# Patient Record
Sex: Female | Born: 1960 | Race: White | Hispanic: No | Marital: Single | Smoking: Former smoker
Health system: Southern US, Community
[De-identification: ages and names within clinical notes are randomized; demographics above are authoritative.]

## PROBLEM LIST (undated history)

## (undated) DIAGNOSIS — M1990 Unspecified osteoarthritis, unspecified site: Secondary | ICD-10-CM

## (undated) DIAGNOSIS — K219 Gastro-esophageal reflux disease without esophagitis: Secondary | ICD-10-CM

## (undated) DIAGNOSIS — E7800 Pure hypercholesterolemia, unspecified: Secondary | ICD-10-CM

## (undated) DIAGNOSIS — I10 Essential (primary) hypertension: Secondary | ICD-10-CM

---

## 2008-10-18 ENCOUNTER — Ambulatory Visit: Payer: Self-pay | Admitting: Specialist

## 2008-11-07 ENCOUNTER — Ambulatory Visit: Payer: Self-pay | Admitting: Specialist

## 2009-07-19 ENCOUNTER — Ambulatory Visit: Payer: Self-pay | Admitting: Gastroenterology

## 2009-08-27 ENCOUNTER — Emergency Department: Payer: Self-pay | Admitting: Internal Medicine

## 2009-10-19 ENCOUNTER — Ambulatory Visit: Payer: Self-pay | Admitting: Family

## 2010-10-23 ENCOUNTER — Ambulatory Visit: Payer: Self-pay | Admitting: Internal Medicine

## 2011-10-29 ENCOUNTER — Ambulatory Visit: Payer: Self-pay | Admitting: Internal Medicine

## 2011-12-23 DIAGNOSIS — I10 Essential (primary) hypertension: Secondary | ICD-10-CM | POA: Insufficient documentation

## 2012-11-05 ENCOUNTER — Ambulatory Visit: Payer: Self-pay | Admitting: Internal Medicine

## 2013-03-17 DIAGNOSIS — I8393 Asymptomatic varicose veins of bilateral lower extremities: Secondary | ICD-10-CM | POA: Insufficient documentation

## 2013-06-05 ENCOUNTER — Emergency Department: Payer: Self-pay | Admitting: Emergency Medicine

## 2013-11-09 ENCOUNTER — Ambulatory Visit: Payer: Self-pay | Admitting: Nurse Practitioner

## 2014-08-25 DIAGNOSIS — M109 Gout, unspecified: Secondary | ICD-10-CM | POA: Insufficient documentation

## 2014-11-10 ENCOUNTER — Ambulatory Visit: Payer: Self-pay | Admitting: Nurse Practitioner

## 2015-07-10 DIAGNOSIS — N200 Calculus of kidney: Secondary | ICD-10-CM | POA: Insufficient documentation

## 2015-07-20 ENCOUNTER — Inpatient Hospital Stay: Payer: PRIVATE HEALTH INSURANCE | Attending: Internal Medicine | Admitting: Internal Medicine

## 2015-07-20 ENCOUNTER — Encounter: Payer: Self-pay | Admitting: Internal Medicine

## 2015-07-20 ENCOUNTER — Inpatient Hospital Stay: Payer: PRIVATE HEALTH INSURANCE

## 2015-07-20 VITALS — BP 154/65 | HR 85 | Temp 96.2°F | Resp 18 | Ht 64.0 in | Wt 177.4 lb

## 2015-07-20 DIAGNOSIS — I10 Essential (primary) hypertension: Secondary | ICD-10-CM | POA: Diagnosis not present

## 2015-07-20 DIAGNOSIS — Z79899 Other long term (current) drug therapy: Secondary | ICD-10-CM | POA: Diagnosis not present

## 2015-07-20 DIAGNOSIS — D696 Thrombocytopenia, unspecified: Secondary | ICD-10-CM | POA: Insufficient documentation

## 2015-07-20 LAB — CBC
HCT: 36.7 % (ref 35.0–47.0)
HEMOGLOBIN: 12.3 g/dL (ref 12.0–16.0)
MCH: 31 pg (ref 26.0–34.0)
MCHC: 33.6 g/dL (ref 32.0–36.0)
MCV: 92.3 fL (ref 80.0–100.0)
PLATELETS: 231 10*3/uL (ref 150–440)
RBC: 3.97 MIL/uL (ref 3.80–5.20)
RDW: 13.1 % (ref 11.5–14.5)
WBC: 7.5 10*3/uL (ref 3.6–11.0)

## 2015-07-20 LAB — IRON AND TIBC
Iron: 75 ug/dL (ref 28–170)
SATURATION RATIOS: 20 % (ref 10.4–31.8)
TIBC: 374 ug/dL (ref 250–450)
UIBC: 299 ug/dL

## 2015-07-20 LAB — RETICULOCYTES
RBC.: 3.97 MIL/uL (ref 3.80–5.20)
RETIC COUNT ABSOLUTE: 47.6 10*3/uL (ref 19.0–183.0)
RETIC CT PCT: 1.2 % (ref 0.4–3.1)

## 2015-07-20 LAB — PROTIME-INR
INR: 0.95
PROTHROMBIN TIME: 12.7 s (ref 11.4–15.0)

## 2015-07-20 LAB — FERRITIN: Ferritin: 37 ng/mL (ref 11–307)

## 2015-07-20 LAB — VITAMIN B12: Vitamin B-12: 279 pg/mL (ref 180–914)

## 2015-07-20 LAB — LACTATE DEHYDROGENASE: LDH: 259 U/L — ABNORMAL HIGH (ref 98–192)

## 2015-07-20 LAB — FIBRINOGEN: FIBRINOGEN: 405 mg/dL (ref 210–470)

## 2015-07-20 LAB — FOLATE: FOLATE: 32 ng/mL (ref >5.9)

## 2015-07-20 LAB — APTT: aPTT: 29 seconds (ref 24–36)

## 2015-07-20 NOTE — Progress Notes (Signed)
[place]  Telephone:(336) [phone] [phone]     ID: Krista English OB: Feb 11, 1961  MR#: [id number]  TMA#:[id number]  No care team member to display  CHIEF COMPLAINT/DIAGNOSIS:  Newly diagnosed Thrombocytopenia of unclear etiology -  patient referred here for Hematology evaluation.  (Labs on 06/25/15 shows platelet count 109, hemoglobin 12.4, MCV 93, W BC 5.7, 62% neutrophils, 27% lymphocytes, 8% monocytes, 2% eosinophils, ANC 3.6, creatinine 0.84, calcium 9.5, LFT unremarkable except GGT of 82, TSH normal at 2.92. On 03/16/15, platelet count normal at 289).  HISTORY OF PRESENT ILLNESS:  Krista English is a 55 year old female with past medical history significant for hypertension, hyperlipidemia who recently had labs done by primary physician and found to have thrombocytopenia. Labs on 06/25/15 shows platelet count 109, hemoglobin 12.4, MCV 93, W BC 5.7, 62% neutrophils, 27% lymphocytes, 8% monocytes, 2% eosinophils, ANC 3.6, creatinine 0.84, calcium 9.5, LFT unremarkable except GGT of 82, TSH normal at 2.92. On 03/16/15, platelet count normal at 289. Patient denies any known prior history of low platelet count denies any history of liver disease or splenomegaly. States that she does not have any bleeding issues, notices minor skin bruising on trauma only. Otherwise no epistaxis, gum bleeding, hemoptysis, hematemesis, bright red blood in stools, melena or hematuria. Appetite is good, denies unintentional weight loss. Otherwise has been doing well, states that she had a finger sprain and right ankle sprain few months ago. No new bone pains. She denies frequent alcohol intake, states that she only drinks couple of beers about 3 times a week, denies hard liquor intake. No fevers or night sweats. No new paresthesias in extremities.  REVIEW OF SYSTEMS:   ROS CONSTITUTIONAL: As in HPI above. No chills, fever or sweats.    ENT:  No headache, dizziness or epistaxis. No ear or jaw pain.  No sinus symptoms. RESPIRATORY:   No cough.  No shortness of breath. No wheezing. No hemoptysis. CARDIAC:  No palpitations.  No retrosternal chest pain. No orthopnea, PND. GI:  No abdominal pain, nausea or vomiting. No diarrhea.   GU:  No dysuria or hematuria.  SKIN: No rashes or pruritus. HEMATOLOGIC: denies bleeding symptoms MUSCULOSKELETAL:  No new bone pains.  EXTREMITY:  No new swelling or pain.  NEURO:  No focal weakness. No numbness or tingling of extremities.  No seizures.   ENDOCRINE:  No polyuria or polydipsia.   PAST MEDICAL HISTORY: Reviewed. Hypertension Hyperlipidemia History of kidney stones  PAST SURGICAL HISTORY: Reviewed. Unremarkable  FAMILY HISTORY: Reviewed. Denies malignancy or hematological disorders.  SOCIAL HISTORY: Reviewed. Social History  Substance Use Topics  . Smoking status: Former Smoker    Quit date: 12/02/1998  . Smokeless tobacco: None  . Alcohol Use: None  Drinks couple of beers about 3 times a week, denies hard liquor intake  No Known Allergies  Current Outpatient Prescriptions  Medication Sig Dispense Refill  . amitriptyline (ELAVIL) 50 MG tablet TAKE 1 TABLET ORALLY NIGHTLY AT BEDTIME  5  . lisinopril-hydrochlorothiazide (PRINZIDE,ZESTORETIC) 10-12.5 MG per tablet Take 1 tablet by mouth daily.  3  . simvastatin (ZOCOR) 40 MG tablet TAKE 1 TABLET ORALLY DAILY IN THE EVENING (REPLACING LIPITOR)  3   No current facility-administered medications for this visit.    PHYSICAL EXAM: Filed Vitals:   07/20/15 1034  BP: 154/65  Pulse: 85  Temp: 96.2 F (35.7 C)  Resp: 18     Body mass index is 30.43 kg/(m^2).      GENERAL: Patient is  alert and oriented and in no acute distress. There is no icterus. HEENT: EOMs intact. Oral exam negative for thrush or lesions. No cervical lymphadenopathy. CVS: S1S2, regular LUNGS: Bilaterally clear to auscultation, no rhonchi. ABDOMEN: Soft, nontender. No hepatosplenomegaly clinically.  NEURO:  grossly nonfocal, cranial nerves are intact. Gait unremarkable. EXTREMITIES: No pedal edema. LYMPHATICS: No palpable adenopathy in axillary or inguinal areas. SKIN: No major bruising, petechiae or ecchymosis. No rash. MUSCULOSKELETAL: No obvious joint redness or swelling   LAB RESULTS: 06/25/15 - platelet count 109, hemoglobin 12.4, MCV 93, W BC 5.7, 62% neutrophils, 27% lymphocytes, 8% monocytes, 2% eosinophils, ANC 3.6, creatinine 0.84, calcium 9.5, LFT unremarkable except GGT of 82, TSH normal at 2.92.  03/16/15 - platelet count normal at 289.   ASSESSMENT / PLAN:   Newly diagnosed Thrombocytopenia of unclear etiology   (Labs on 06/25/15 shows platelet count 109, hemoglobin 12.4, MCV 93, W BC 5.7, 62% neutrophils, 27% lymphocytes, 8% monocytes, 2% eosinophils, ANC 3.6, creatinine 0.84, calcium 9.5, LFT unremarkable except GGT of 82, TSH normal at 2.92. On 03/16/15, platelet count normal at 289)  -  patient referred here for Hematology evaluation. Reviewed records and labs sent by referring physician and d/w patient. Etiology for thrombocytopenia is unclear at this time, ? Immune thrombocytopenic purpura (ITP) versus other etiology. She needs additional workup, will draw labs today for CBC and differential, reticulocyte count, B-12, folate, iron study, LDH, haptoglobin, platelet antibody test, SPEP, HBsAg, HC antibody, HIV antibody, PT/PTT/fibrinogen. Will get ultrasound of liver and spleen to evaluate for hepatosplenomegaly or cirrhosis of the liver. Will see her back in 1 week and make further plan of management based upon results of this workup, including decide if she would need bone marrow biopsy evaluation. Patient otherwise does not have any ongoing bleeding issues and does not need immediate intervention at this time.     In between visits, the patient has been advised to call or come to the ER in case of fevers, chills, bleeding, acute sickness or new symptoms. Patient is agreeable to this  plan.     Leia Alf, MD   07/20/2015 2:02 PM

## 2015-07-20 NOTE — Progress Notes (Signed)
Patient is referred here by Kasandra Knudsen, NP for evaluation of low plts. Patient states that she has been feeling fine and has no complaints. She denies any abnormal bleeding or bruising.

## 2015-07-21 LAB — PLATELET ANTIBODY PROFILE, SERUM
HLA AB SER QL EIA: POSITIVE — AB
IA/IIA Antibody: NEGATIVE
IB/IX Antibody: NEGATIVE
IIB/IIIA ANTIBODY: NEGATIVE

## 2015-07-21 LAB — PROTEIN ELECTROPHORESIS, SERUM
A/G Ratio: 1.1 (ref 0.7–1.7)
Albumin ELP: 3.7 g/dL (ref 2.9–4.4)
Alpha-1-Globulin: 0.2 g/dL (ref 0.0–0.4)
Alpha-2-Globulin: 0.7 g/dL (ref 0.4–1.0)
Beta Globulin: 1.1 g/dL (ref 0.7–1.3)
GAMMA GLOBULIN: 1.3 g/dL (ref 0.4–1.8)
GLOBULIN, TOTAL: 3.4 g/dL (ref 2.2–3.9)
TOTAL PROTEIN ELP: 7.1 g/dL (ref 6.0–8.5)

## 2015-07-21 LAB — HEPATITIS B SURFACE ANTIGEN: HEP B S AG: NEGATIVE

## 2015-07-21 LAB — HAPTOGLOBIN: HAPTOGLOBIN: 90 mg/dL (ref 34–200)

## 2015-07-21 LAB — HEPATITIS C ANTIBODY: HCV Ab: <0.1 s/co ratio (ref 0.0–0.9)

## 2015-07-21 LAB — HIV ANTIBODY (ROUTINE TESTING W REFLEX): HIV Screen 4th Generation wRfx: NONREACTIVE

## 2015-07-24 ENCOUNTER — Ambulatory Visit
Admission: RE | Admit: 2015-07-24 | Discharge: 2015-07-24 | Disposition: A | Discharge status: Home / Self Care | Payer: PRIVATE HEALTH INSURANCE | Source: Ambulatory Visit | Attending: Internal Medicine | Admitting: Internal Medicine

## 2015-07-24 DIAGNOSIS — D696 Thrombocytopenia, unspecified: Secondary | ICD-10-CM | POA: Insufficient documentation

## 2015-07-27 ENCOUNTER — Inpatient Hospital Stay (HOSPITAL_BASED_OUTPATIENT_CLINIC_OR_DEPARTMENT_OTHER): Payer: PRIVATE HEALTH INSURANCE | Admitting: Internal Medicine

## 2015-07-27 VITALS — BP 176/97 | HR 80 | Temp 97.4°F | Resp 18 | Ht 64.0 in | Wt 178.1 lb

## 2015-07-27 DIAGNOSIS — Z79899 Other long term (current) drug therapy: Secondary | ICD-10-CM | POA: Diagnosis not present

## 2015-07-27 DIAGNOSIS — I10 Essential (primary) hypertension: Secondary | ICD-10-CM

## 2015-07-27 DIAGNOSIS — D696 Thrombocytopenia, unspecified: Secondary | ICD-10-CM | POA: Diagnosis not present

## 2015-08-13 NOTE — Progress Notes (Signed)
[place]  Telephone:(336) [phone] Fax:(336) [phone]     ID: Krista English OB: 07-08-1961  MR#: [id number]  CSN#:[id number]  [name], NP as PCP - General (Nurse Practitioner)  CHIEF COMPLAINT/DIAGNOSIS:  Thrombocytopenia of unclear etiology -  Spontaneously improved on CBC of 07/20/15.  07/20/15 - WBC 7.5, Hb 12.3, platelets 231K, remaining workup unremarkable.  (Prior labs on 06/25/15 shows platelet count 109, hemoglobin 12.4, MCV 93, W BC 5.7, 62% neutrophils, 27% lymphocytes, 8% monocytes, 2% eosinophils, ANC 3.6, creatinine 0.84, calcium 9.5, LFT unremarkable except GGT of 82, TSH normal at 2.92. On 03/16/15, platelet count normal at 289).   HISTORY OF PRESENT ILLNESS:  Patient returns for continued hematology follow-up. CBC done on August 18 showed the platelet count had normalized. Denies any bleeding symptoms. No new complaints. Denies frequent alcohol intake, states that she only drinks couple of beers about 3 times a week, denies hard liquor intake.   REVIEW OF SYSTEMS:   ROS As in HPI above. No chills, fever. No cough, shortness of breath or hemoptysis. No abdominal pain, nausea or vomiting. No new bone pains.   PAST MEDICAL HISTORY: Reviewed. Hypertension Hyperlipidemia History of kidney stones  PAST SURGICAL HISTORY: Reviewed. Unremarkable  FAMILY HISTORY: Reviewed. Denies malignancy or hematological disorders.  SOCIAL HISTORY: Reviewed. Social History  Substance Use Topics  . Smoking status: Former Smoker    Quit date: 12/02/1998  . Smokeless tobacco: Not on file  . Alcohol Use: Not on file  Drinks couple of beers about 3 times a week, denies hard liquor intake  No Known Allergies  Current Outpatient Prescriptions  Medication Sig Dispense Refill  . amitriptyline (ELAVIL) 50 MG tablet TAKE 1 TABLET ORALLY NIGHTLY AT BEDTIME  5  . lisinopril-hydrochlorothiazide (PRINZIDE,ZESTORETIC) 10-12.5 MG per tablet Take 1 tablet  by mouth daily.  3  . simvastatin (ZOCOR) 40 MG tablet TAKE 1 TABLET ORALLY DAILY IN THE EVENING (REPLACING LIPITOR)  3   No current facility-administered medications for this visit.    PHYSICAL EXAM: Filed Vitals:   07/27/15 0901  BP: 176/97  Pulse: 80  Temp: 97.4 F (36.3 C)  Resp: 18     Body mass index is 30.56 kg/(m^2).      GENERAL: Alert and oriented and in no acute distress. There is no icterus. LUNGS: Bilaterally clear to auscultation, no rhonchi. ABDOMEN: Soft, nontender.  SKIN: No major bruising, petechiae or ecchymosis.   LAB RESULTS:     07/20/15 - WBC 7.5, Hb 12.3, platelets 231K, 06/25/15 - platelet count 109, hemoglobin 12.4, MCV 93, W BC 5.7, 62% neutrophils, 27% lymphocytes, 8% monocytes, 2% eosinophils, ANC 3.6, creatinine 0.84, calcium 9.5, LFT unremarkable except GGT of 82, TSH normal at 2.92.  03/16/15 - platelet count normal at 289.  07/24/15 - Ultrasound abdomen. IMPRESSION: 1. Normal appearance of the liver and spleen. There is no acute hepatobiliary abnormality.  2. Slightly hyperechoic focus in the midpole cortex of the right kidney. Further evaluation with renal protocol CT scan or MRI is recommended.   ASSESSMENT / PLAN:   1. Thrombocytopenia of unclear etiology -  Spontaneously improved on CBC of 07/20/15 (WBC 7.5, Hb 12.3, platelets 231K, remaining workup unremarkable)  -  reviewed labs done and discussed with patient. CBC on August 18 shows a platelet count has normalized. Patient is clinically doing well without any bleeding symptoms. Have explained that she could have had transient thrombocytopenia which has improved. Given above, she does not need continued hematology  follow-up and is being discharged from our clinic. Recommend monitoring CBC upon primary care visits, I would be happy to see her back in the future if hematology issues should recur. 2. Ultrasound abdomen reports a 1.9 x 0.9 x 1.6 cmhyperechoic focus in the midpole cortex of the right  kidney. Further evaluation with renal protocol CT scan or MRI is recommended. Patient prefers this to be done by her PMD, and report has been forwarded to their office.     In between visits, the patient has been advised to call MD or come to the ER in case of fevers, bleeding, acute sickness or new symptoms. Patient is agreeable to this plan.     Leia Alf, MD   08/13/2015 10:24 PM

## 2016-09-11 ENCOUNTER — Other Ambulatory Visit: Payer: Self-pay | Admitting: Nurse Practitioner

## 2016-09-11 DIAGNOSIS — Z1231 Encounter for screening mammogram for malignant neoplasm of breast: Secondary | ICD-10-CM

## 2016-09-13 ENCOUNTER — Encounter: Payer: Self-pay | Admitting: Radiology

## 2016-09-13 ENCOUNTER — Ambulatory Visit
Admission: RE | Admit: 2016-09-13 | Discharge: 2016-09-13 | Disposition: A | Discharge status: Home / Self Care | Payer: PRIVATE HEALTH INSURANCE | Source: Ambulatory Visit | Attending: Nurse Practitioner | Admitting: Nurse Practitioner

## 2016-09-13 DIAGNOSIS — Z1231 Encounter for screening mammogram for malignant neoplasm of breast: Secondary | ICD-10-CM | POA: Insufficient documentation

## 2016-11-05 DIAGNOSIS — M25519 Pain in unspecified shoulder: Secondary | ICD-10-CM | POA: Insufficient documentation

## 2017-09-14 ENCOUNTER — Other Ambulatory Visit: Payer: Self-pay | Admitting: Nurse Practitioner

## 2017-09-14 DIAGNOSIS — Z1231 Encounter for screening mammogram for malignant neoplasm of breast: Secondary | ICD-10-CM

## 2017-09-17 ENCOUNTER — Ambulatory Visit
Admission: RE | Admit: 2017-09-17 | Discharge: 2017-09-17 | Disposition: A | Discharge status: Home / Self Care | Payer: PRIVATE HEALTH INSURANCE | Source: Ambulatory Visit | Attending: Nurse Practitioner | Admitting: Nurse Practitioner

## 2017-09-17 DIAGNOSIS — Z1231 Encounter for screening mammogram for malignant neoplasm of breast: Secondary | ICD-10-CM | POA: Diagnosis present

## 2018-05-26 ENCOUNTER — Encounter: Payer: Self-pay | Admitting: Nurse Practitioner

## 2018-06-01 ENCOUNTER — Encounter: Payer: Self-pay | Admitting: *Deleted

## 2018-06-17 ENCOUNTER — Encounter: Payer: Self-pay | Admitting: Gastroenterology

## 2018-06-17 ENCOUNTER — Ambulatory Visit (INDEPENDENT_AMBULATORY_CARE_PROVIDER_SITE_OTHER): Payer: PRIVATE HEALTH INSURANCE | Admitting: Gastroenterology

## 2018-06-17 VITALS — BP 119/74 | HR 71 | Ht 62.0 in | Wt 180.6 lb

## 2018-06-17 DIAGNOSIS — R748 Abnormal levels of other serum enzymes: Secondary | ICD-10-CM | POA: Diagnosis not present

## 2018-06-17 NOTE — Progress Notes (Signed)
Krista English [address]  [place]  [place], [place] [address]  Main: [phone]  Fax: [phone]   Gastroenterology Consultation  Referring Provider:     Danelle Berry, NP Primary Care Physician:  Danelle Berry, NP Primary Gastroenterologist:  Dr. Vonda English Reason for Consultation:     Elevated liver enzymes        HPI:   Chief complaint: Elevated liver enzymes  Krista English is a 57 y.o. y/o female referred for consultation & management  by Dr. Danelle Berry, NP.  Patient was referred for elevated liver enzymes.  She denies any previous history of GI bleeds, cirrhosis, abdominal or lower extremity swelling.  Reports intake of 2-3 beers every other day.  Has done this for years.  Has not smoked in 25 years.  No drug use. The patient denies abdominal or flank pain, anorexia, nausea or vomiting, dysphagia, change in bowel habits or black or bloody stools or weight loss.  June 2019 labs: Mildly elevated AST and ALT at 43 and 48 respectively Mildly elevated alk phos at 123, elevated GGT at 128 Normal total bilirubin, normal alkaline, normal total protein Normal hemoglobin at 12.7, normal platelets of 284  December 2018 labs: AST 70, ALT 81, alk phos 191, GGT 228 Normal total bilirubin, albumin, total protein Normal hemoglobin and platelets  Previous colonoscopy, August 2010 for screening, internal hemorrhoids.  Evaluated by Dr. Ma Hillock of hematology in 2016 due to thrombocytopenia.  As per their note, CBC showed spontaneous improvement in platelets, and work-up was unremarkable, and so patient was discharged from the clinic at that time.  Hep C antibody, hep B surface antigen, haptoglobin, fibrinogen, INR, HIV were negative at the time.  Ferritin was normal at 37.  Iron level was normal at 75.  Abdominal ultrasound also ordered by them showed normal appearance of the liver and spleen in 2016.  Past medical history: Hypertension,  hypercholesterolemia Past surgical history: None  Prior to Admission medications   Medication Sig Start Date End Date Taking? Authorizing Provider  amitriptyline (ELAVIL) 50 MG tablet TAKE 1 TABLET ORALLY NIGHTLY AT BEDTIME 06/27/15   [provider]  lisinopril-hydrochlorothiazide (PRINZIDE,ZESTORETIC) 10-12.5 MG per tablet Take 1 tablet by mouth daily. 06/13/15   [provider]  simvastatin (ZOCOR) 40 MG tablet TAKE 1 TABLET ORALLY DAILY IN THE EVENING (REPLACING LIPITOR) 05/11/15   [provider]    Family History  Problem Relation Age of Onset  . Breast cancer Neg Hx      Social History   Tobacco Use  . Smoking status: Former Smoker    Last attempt to quit: 12/02/1998    Years since quitting: 19.5  Substance Use Topics  . Alcohol use: Not on file  . Drug use: Not on file    Allergies as of 06/17/2018  . (No Known Allergies)    Review of Systems:    All systems reviewed and negative except where noted in HPI.   Physical Exam:  No LMP recorded. Patient is postmenopausal. Vitals:   06/17/18 1459  BP: 119/74  Pulse: 71  Weight: 180 lb 9.6 oz (81.9 kg)  Height: _0  (1.575 m)    Psych:  Alert and cooperative. Normal mood and affect. General:   Alert,  Well-developed, well-nourished, pleasant and cooperative in NAD Head:  Normocephalic and atraumatic. Eyes:  Sclera clear, no icterus.   Conjunctiva pink. Ears:  Normal auditory acuity. Nose:  No deformity, discharge, or lesions. Mouth:  No deformity or lesions,oropharynx pink & moist. Neck:  Supple; no masses or thyromegaly. Lungs:  Respirations even and unlabored.  Clear throughout to auscultation.   No wheezes, crackles, or rhonchi. No acute distress. Heart:  Regular rate and rhythm; no murmurs, clicks, rubs, or gallops. Abdomen:  Normal bowel sounds.  No bruits.  Soft, non-tender and non-distended without masses, hepatosplenomegaly or hernias noted.  No guarding or rebound tenderness.      Msk:  Symmetrical without gross deformities. Good, equal movement & strength bilaterally. Pulses:  Normal pulses noted. Extremities:  No clubbing or edema.  No cyanosis. Neurologic:  Alert and oriented x3;  grossly normal neurologically. Skin:  Intact without significant lesions or rashes. No jaundice. Lymph Nodes:  No significant cervical adenopathy. Psych:  Alert and cooperative. Normal mood and affect.   Labs: Lab work for in June 2019, December 2018 reviewed under scanned chart Abdominal ultrasound reviewed from 2016  Assessment and Plan:   Krista English is a 57 y.o. y/o female has been referred for elevated liver enzymes  We will initiate work-up with viral hepatitis, and autoimmune hepatitis labs Liver enzymes have improved since December 2018 labs but were elevated on June 2019 labs as detailed in HPI We will also order liver ultrasound for evaluation  Patient was educated on abstinence from alcohol Educated extensively on fatty liver as that is the likely cause of her elevated liver enzymes Risk of progression to cirrhosis and comorbidity associated with it, if diet, exercise, weight loss, and alcohol abstinence is not instituted was discussed in detail and she verbalized understanding.  No indication for urgent endoscopy at this time Will await above work-up No clinical evidence of cirrhosis at this time  Follow-up in clinic  Screening colonoscopy due August 2020  Dr Krista English

## 2018-06-17 NOTE — Patient Instructions (Signed)
F/U 3 months To schedule your ultrasound, call: [phone].  Fatty Liver Fatty liver, also called hepatic steatosis or steatohepatitis, is a condition in which too much fat has built up in your liver cells. The liver removes harmful substances from your bloodstream. It produces fluids your body needs. It also helps your body use and store energy from the food you eat. In many cases, fatty liver does not cause symptoms or problems. It is often diagnosed when tests are being done for other reasons. However, over time, fatty liver can cause inflammation that may lead to more serious liver problems, such as scarring of the liver (cirrhosis). What are the causes? Causes of fatty liver may include:  Drinking too much alcohol.  Poor nutrition.  Obesity.  Cushing syndrome.  Diabetes.  Hyperlipidemia.  Pregnancy.  Certain drugs.  Poisons.  Some viral infections.  What increases the risk? You may be more likely to develop fatty liver if you:  Abuse alcohol.  Are pregnant.  Are overweight.  Have diabetes.  Have hepatitis.  Have a high triglyceride level.  What are the signs or symptoms? Fatty liver often does not cause any symptoms. In cases where symptoms develop, they can include:  Fatigue.  Weakness.  Weight loss.  Confusion.  Abdominal pain.  Yellowing of your skin and the white parts of your eyes (jaundice).  Nausea and vomiting.  How is this diagnosed? Fatty liver may be diagnosed by:  Physical exam and medical history.  Blood tests.  Imaging tests, such as an ultrasound, CT scan, or MRI.  Liver biopsy. A small sample of liver tissue is removed using a needle. The sample is then looked at under a microscope.  How is this treated? Fatty liver is often caused by other health conditions. Treatment for fatty liver may involve medicines and lifestyle changes to manage conditions such as:  Alcoholism.  High cholesterol.  Diabetes.  Being  overweight or obese.  Follow these instructions at home:  Eat a healthy diet as directed by your health care provider.  Exercise regularly. This can help you lose weight and control your cholesterol and diabetes. Talk to your health care provider about an exercise plan and which activities are best for you.  Do not drink alcohol.  Take medicines only as directed by your health care provider. Contact a health care provider if: You have difficulty controlling your:  Blood sugar.  Cholesterol.  Alcohol consumption.  Get help right away if:  You have abdominal pain.  You have jaundice.  You have nausea and vomiting. This information is not intended to replace advice given to you by your health care provider. Make sure you discuss any questions you have with your health care provider. Document Released: 01/03/2006 Document Revised: 04/25/2016 Document Reviewed: 03/30/2014 Elsevier Interactive Patient Education  [employer].

## 2018-07-01 LAB — HEPATITIS B CORE ANTIBODY, TOTAL: Hep B Core Total Ab: NEGATIVE

## 2018-07-01 LAB — PROTIME-INR
INR: 1 (ref 0.8–1.2)
PROTHROMBIN TIME: 10.6 s (ref 9.1–12.0)

## 2018-07-01 LAB — MITOCHONDRIAL/SMOOTH MUSCLE AB PNL
Mitochondrial Ab: <20 Units (ref 0.0–20.0)
Smooth Muscle Ab: 8 Units (ref 0–19)

## 2018-07-01 LAB — CERULOPLASMIN: Ceruloplasmin: 26.4 mg/dL (ref 19.0–39.0)

## 2018-07-01 LAB — HEPATITIS C ANTIBODY (REFLEX)

## 2018-07-01 LAB — HEPATITIS B SURFACE ANTIGEN: HEP B S AG: NEGATIVE

## 2018-07-01 LAB — HCV RNA QUANT: HEPATITIS C QUANTITATION: NOT DETECTED [IU]/mL

## 2018-07-01 LAB — HEPATITIS B SURFACE ANTIBODY,QUALITATIVE: HEP B SURFACE AB, QUAL: NONREACTIVE

## 2018-07-01 LAB — IRON AND TIBC
Iron Saturation: 23 % (ref 15–55)
Iron: 81 ug/dL (ref 27–159)
TIBC: 355 ug/dL (ref 250–450)
UIBC: 274 ug/dL (ref 131–425)

## 2018-07-01 LAB — FERRITIN: FERRITIN: 35 ng/mL (ref 15–150)

## 2018-07-01 LAB — HCV COMMENT:

## 2018-07-01 LAB — HEPATITIS A ANTIBODY, TOTAL: Hep A Total Ab: POSITIVE — AB

## 2018-07-02 ENCOUNTER — Encounter: Payer: Self-pay | Admitting: Gastroenterology

## 2018-07-09 ENCOUNTER — Ambulatory Visit
Admission: RE | Admit: 2018-07-09 | Discharge: 2018-07-09 | Disposition: A | Discharge status: Home / Self Care | Payer: PRIVATE HEALTH INSURANCE | Source: Ambulatory Visit | Attending: Gastroenterology | Admitting: Gastroenterology

## 2018-07-09 DIAGNOSIS — R748 Abnormal levels of other serum enzymes: Secondary | ICD-10-CM

## 2018-07-13 ENCOUNTER — Telehealth: Payer: Self-pay | Admitting: Gastroenterology

## 2018-07-13 NOTE — Telephone Encounter (Signed)
Pt left vm to see if her liver stint results are back

## 2018-07-17 ENCOUNTER — Telehealth: Payer: Self-pay | Admitting: Gastroenterology

## 2018-07-17 NOTE — Telephone Encounter (Signed)
Patient Krista English that she has been waiting for you to call her with results from her liver scan. She would like for you to call her today and leave a message if needed.

## 2018-07-20 ENCOUNTER — Telehealth: Payer: Self-pay | Admitting: Gastroenterology

## 2018-07-20 NOTE — Telephone Encounter (Signed)
Pt left vm for Krista English to find out about her scan

## 2018-07-21 NOTE — Telephone Encounter (Signed)
I left voice message as requested to results of ultrasound.

## 2018-08-28 DIAGNOSIS — E785 Hyperlipidemia, unspecified: Secondary | ICD-10-CM | POA: Insufficient documentation

## 2018-09-17 ENCOUNTER — Ambulatory Visit: Payer: PRIVATE HEALTH INSURANCE | Admitting: Gastroenterology

## 2018-09-17 ENCOUNTER — Other Ambulatory Visit: Payer: Self-pay | Admitting: Nurse Practitioner

## 2018-09-17 DIAGNOSIS — Z1231 Encounter for screening mammogram for malignant neoplasm of breast: Secondary | ICD-10-CM

## 2018-09-22 ENCOUNTER — Ambulatory Visit
Admission: RE | Admit: 2018-09-22 | Discharge: 2018-09-22 | Disposition: A | Discharge status: Home / Self Care | Payer: PRIVATE HEALTH INSURANCE | Source: Ambulatory Visit | Attending: Nurse Practitioner | Admitting: Nurse Practitioner

## 2018-09-22 DIAGNOSIS — Z1231 Encounter for screening mammogram for malignant neoplasm of breast: Secondary | ICD-10-CM | POA: Insufficient documentation

## 2018-10-14 ENCOUNTER — Ambulatory Visit (INDEPENDENT_AMBULATORY_CARE_PROVIDER_SITE_OTHER): Payer: PRIVATE HEALTH INSURANCE | Admitting: Gastroenterology

## 2018-10-14 ENCOUNTER — Encounter (INDEPENDENT_AMBULATORY_CARE_PROVIDER_SITE_OTHER): Payer: Self-pay

## 2018-10-14 ENCOUNTER — Encounter: Payer: Self-pay | Admitting: Gastroenterology

## 2018-10-14 VITALS — BP 124/81 | HR 78 | Ht 62.0 in | Wt 180.8 lb

## 2018-10-14 DIAGNOSIS — R12 Heartburn: Secondary | ICD-10-CM

## 2018-10-14 DIAGNOSIS — R748 Abnormal levels of other serum enzymes: Secondary | ICD-10-CM

## 2018-10-14 NOTE — Patient Instructions (Signed)
Bed wedge 3months to schedule colonoscopy (recall letter to be sent)  Gastroesophageal Reflux Disease, Adult Normally, food travels down the esophagus and stays in the stomach to be digested. If a person has gastroesophageal reflux disease (GERD), food and stomach acid move back up into the esophagus. When this happens, the esophagus becomes sore and swollen (inflamed). Over time, GERD can make small holes (ulcers) in the lining of the esophagus. Follow these instructions at home: Diet  Follow a diet as told by your doctor. You may need to avoid foods and drinks such as: ? Coffee and tea (with or without caffeine). ? Drinks that contain alcohol. ? Energy drinks and sports drinks. ? Carbonated drinks or sodas. ? Chocolate and cocoa. ? Peppermint and mint flavorings. ? Garlic and onions. ? Horseradish. ? Spicy and acidic foods, such as peppers, chili powder, curry powder, vinegar, hot sauces, and BBQ sauce. ? Citrus fruit juices and citrus fruits, such as oranges, lemons, and limes. ? Tomato-based foods, such as red sauce, chili, salsa, and pizza with red sauce. ? Fried and fatty foods, such as donuts, french fries, potato chips, and high-fat dressings. ? High-fat meats, such as hot dogs, rib eye steak, sausage, ham, and bacon. ? High-fat dairy items, such as whole milk, butter, and cream cheese.  Eat small meals often. Avoid eating large meals.  Avoid drinking large amounts of liquid with your meals.  Avoid eating meals during the 2-3 hours before bedtime.  Avoid lying down right after you eat.  Do not exercise right after you eat. General instructions  Pay attention to any changes in your symptoms.  Take over-the-counter and prescription medicines only as told by your doctor. Do not take aspirin, ibuprofen, or other NSAIDs unless your doctor says it is okay.  Do not use any tobacco products, including cigarettes, chewing tobacco, and e-cigarettes. If you need help quitting, ask  your doctor.  Wear loose clothes. Do not wear anything tight around your waist.  Raise (elevate) the head of your bed about 6 inches (15 cm).  Try to lower your stress. If you need help doing this, ask your doctor.  If you are overweight, lose an amount of weight that is healthy for you. Ask your doctor about a safe weight loss goal.  Keep all follow-up visits as told by your doctor. This is important. Contact a doctor if:  You have new symptoms.  You lose weight and you do not know why it is happening.  You have trouble swallowing, or it hurts to swallow.  You have wheezing or a cough that keeps happening.  Your symptoms do not get better with treatment.  You have a hoarse voice. Get help right away if:  You have pain in your arms, neck, jaw, teeth, or back.  You feel sweaty, dizzy, or light-headed.  You have chest pain or shortness of breath.  You throw up (vomit) and your throw up looks like blood or coffee grounds.  You pass out (faint).  Your poop (stool) is bloody or black.  You cannot swallow, drink, or eat. This information is not intended to replace advice given to you by your health care provider. Make sure you discuss any questions you have with your health care provider. Document Released: 05/06/2008 Document Revised: 04/25/2016 Document Reviewed: 03/15/2015 Elsevier Interactive Patient Education  [employer].

## 2018-10-15 LAB — COMPREHENSIVE METABOLIC PANEL
A/G RATIO: 1.6 (ref 1.2–2.2)
ALT: 40 IU/L — ABNORMAL HIGH (ref 0–32)
AST: 31 IU/L (ref 0–40)
Albumin: 4.5 g/dL (ref 3.5–5.5)
Alkaline Phosphatase: 100 IU/L (ref 39–117)
BILIRUBIN TOTAL: 0.4 mg/dL (ref 0.0–1.2)
BUN/Creatinine Ratio: 27 — ABNORMAL HIGH (ref 9–23)
BUN: 27 mg/dL — ABNORMAL HIGH (ref 6–24)
CALCIUM: 10.1 mg/dL (ref 8.7–10.2)
CO2: 24 mmol/L (ref 20–29)
Chloride: 100 mmol/L (ref 96–106)
Creatinine, Ser: 1 mg/dL (ref 0.57–1.00)
GFR, EST AFRICAN AMERICAN: 71 mL/min/{1.73_m2} (ref >59)
GFR, EST NON AFRICAN AMERICAN: 62 mL/min/{1.73_m2} (ref >59)
GLOBULIN, TOTAL: 2.8 g/dL (ref 1.5–4.5)
Glucose: 92 mg/dL (ref 65–99)
POTASSIUM: 4.6 mmol/L (ref 3.5–5.2)
SODIUM: 139 mmol/L (ref 134–144)
TOTAL PROTEIN: 7.3 g/dL (ref 6.0–8.5)

## 2018-10-15 NOTE — Progress Notes (Signed)
Vonda Antigua, MD [address]  [place]  [place], [place] [address]  Main: [phone]  Fax: [phone]   Primary Care Physician: Danelle Berry, NP  Primary Gastroenterologist:  Dr. Vonda Antigua  Chief Complaint  Patient presents with  . Follow-up    elevated liver enzymes; acid reflux    HPI: Krista English is a 57 y.o. female here for follow-up of elevated liver enzymes.  Patient also reports intermittent heartburn. The patient denies abdominal or flank pain, anorexia, nausea or vomiting, dysphagia, change in bowel habits or black or bloody stools or weight loss.  Reports taking Nexium over-the-counter when intermittent heartburn occurs and it relieves symptoms.  Previous history: She denies any previous history of GI bleeds, cirrhosis, abdominal or lower extremity swelling.  Reports intake of 2-3 beers every other day.  Has done this for years.  Has not smoked in 25 years.  No drug use.   June 2019 labs: Mildly elevated AST and ALT at 43 and 48 respectively Mildly elevated alk phos at 123, elevated GGT at 128 Normal total bilirubin, normal alkaline, normal total protein Normal hemoglobin at 12.7, normal platelets of 284  December 2018 labs: AST 70, ALT 81, alk phos 191, GGT 228 Normal total bilirubin, albumin, total protein Normal hemoglobin and platelets  Previous colonoscopy, August 2010 for screening, internal hemorrhoids.  Evaluated by Dr. Ma Hillock of hematology in 2016 due to thrombocytopenia.  As per their note, CBC showed spontaneous improvement in platelets, and work-up was unremarkable, and so patient was discharged from the clinic at that time.  Hep C antibody, hep B surface antigen, haptoglobin, fibrinogen, INR, HIV were negative at the time.  Ferritin was normal at 37.  Iron level was normal at 75.  Abdominal ultrasound also ordered by them showed normal appearance of the liver and spleen in 2016.   Current Outpatient Medications    Medication Sig Dispense Refill  . losartan-hydrochlorothiazide (HYZAAR) 100-25 MG tablet Take 1 tablet by mouth daily.    . Pitavastatin Calcium (LIVALO) 2 MG TABS Take 1 tablet by mouth every evening.     No current facility-administered medications for this visit.     Allergies as of 10/14/2018 - Review Complete 10/14/2018  Allergen Reaction Noted  . Ace inhibitors Other (See Comments) and Shortness Of Breath 07/01/2016    ROS:  General: Negative for anorexia, weight loss, fever, chills, fatigue, weakness. ENT: Negative for hoarseness, difficulty swallowing , nasal congestion. CV: Negative for chest pain, angina, palpitations, dyspnea on exertion, peripheral edema.  Respiratory: Negative for dyspnea at rest, dyspnea on exertion, cough, sputum, wheezing.  GI: See history of present illness. GU:  Negative for dysuria, hematuria, urinary incontinence, urinary frequency, nocturnal urination.  Endo: Negative for unusual weight change.    Physical Examination:   BP 124/81   Pulse 78   Ht '5\' 2"'  (1.575 m)   Wt 180 lb 12.8 oz (82 kg)   BMI 33.07 kg/m   General: Well-nourished, well-developed in no acute distress.  Eyes: No icterus. Conjunctivae pink. Mouth: Oropharyngeal mucosa moist and pink , no lesions erythema or exudate. Neck: Supple, Trachea midline Abdomen: Bowel sounds are normal, nontender, nondistended, no hepatosplenomegaly or masses, no abdominal bruits or hernia , no rebound or guarding.   Extremities: No lower extremity edema. No clubbing or deformities. Neuro: Alert and oriented x 3.  Grossly intact. Skin: Warm and dry, no jaundice.   Psych: Alert and cooperative, normal mood and affect.   Labs: CMP  Component Value Date/Time   NA 139 10/14/2018 1526   K 4.6 10/14/2018 1526   CL 100 10/14/2018 1526   CO2 24 10/14/2018 1526   GLUCOSE 92 10/14/2018 1526   BUN 27 (H) 10/14/2018 1526   CREATININE 1.00 10/14/2018 1526   CALCIUM 10.1 10/14/2018 1526   PROT  7.3 10/14/2018 1526   ALBUMIN 4.5 10/14/2018 1526   AST 31 10/14/2018 1526   ALT 40 (H) 10/14/2018 1526   ALKPHOS 100 10/14/2018 1526   BILITOT 0.4 10/14/2018 1526   GFRNONAA 62 10/14/2018 1526   GFRAA 71 10/14/2018 1526   Lab Results  Component Value Date   WBC 7.5 07/20/2015   HGB 12.3 07/20/2015   HCT 36.7 07/20/2015   MCV 92.3 07/20/2015   PLT 231 07/20/2015    Imaging Studies: Mm 3d Screen Breast Bilateral  Result Date: 09/22/2018 CLINICAL DATA:  Screening. EXAM: DIGITAL SCREENING BILATERAL MAMMOGRAM WITH TOMO AND CAD COMPARISON:  Previous exam(s). ACR Breast Density Category b: There are scattered areas of fibroglandular density. FINDINGS: There are no findings suspicious for malignancy. Images were processed with CAD. IMPRESSION: No mammographic evidence of malignancy. A result letter of this screening mammogram will be mailed directly to the patient. RECOMMENDATION: Screening mammogram in one year. (Code:SM-B-01Y) BI-RADS CATEGORY  1: Negative. Electronically Signed   By: Franki Cabot M.D.   On: 09/22/2018 10:33    Assessment and Plan:   Krista English is a 57 y.o. y/o female here for follow-up of elevated liver enzymes, and reporting intermittent heartburn about once or twice a week relieved with as needed Nexium  Intermittent heartburn Patient educated extensively on acid reflux lifestyle modification, including buying a bed wedge, not eating 3 hrs before bedtime, diet modifications, and handout given for the same.  No alarm symptoms present Patient was advised not to use Nexium as needed, and she can use over-the-counter Tums or Pepcid as needed if lifestyle modifications did not lead to relief of heartburn.  I have asked her to start taking Pepcid daily for 2 weeks only if heartburn becomes more frequent than once or twice a week and she verbalized understanding.  I have also asked her to notify us if heartburn becomes more frequent and she verbalized  understanding.  Elevated liver enzymes Patient educated again about abstaining from alcohol as it can lead to cirrhosis or worsening of fatty liver and she verbalized understanding Ultrasound on August 2019 showed unremarkable appearance of the liver parenchyma. ALT mildly elevated to 40, and other transaminases are normal.  Liver enzymes have therefore overall improved.  Mildly elevated liver enzymes likely due to underlying fatty liver Diet, weight loss, and exercise encouraged along with avoiding hepatotoxic drugs including alcohol Risk of progression to cirrhosis if above measures are not instituted were discussed as well, and patient verbalized understanding Liver blood work otherwise complete and negative except for hepatitis A antibody positive which is consistent with immunity Patient again educated about getting hepatitis B vaccination by primary care provider and she verbalized understanding.  Incidental 3 mm gallbladder polyp on ultrasound Repeat ultrasound recommended in 1 year  Screening colonoscopy Last screening colonoscopy was in 2010 patient states that she was told it was normal and was asked to repeated in 10 years She states it was done summer 2010, I have recommended that she get her screening colonoscopy done by summer 2020 and have offered that she can get this scheduled at this time.  She would like to wait till 2020 to get  it scheduled  We will set recall for colonoscopy.  Follow-up in clinic in 6 to 12 months.  Dr Vonda Antigua

## 2019-03-19 ENCOUNTER — Other Ambulatory Visit: Payer: Self-pay | Admitting: Family Medicine

## 2019-03-20 LAB — CMP12+LP+TP+TSH+6AC+CBC/D/PLT
ALT: 56 IU/L — ABNORMAL HIGH (ref 0–32)
AST: 61 IU/L — ABNORMAL HIGH (ref 0–40)
Albumin/Globulin Ratio: 2 (ref 1.2–2.2)
Albumin: 4.5 g/dL (ref 3.8–4.9)
Alkaline Phosphatase: 58 IU/L (ref 39–117)
BUN/Creatinine Ratio: 19 (ref 12–28)
BUN: 20 mg/dL (ref 8–27)
Basophils Absolute: 0 10*3/uL (ref 0.0–0.2)
Basos: 1 %
Bilirubin Total: 0.4 mg/dL (ref 0.0–1.2)
Calcium: 9.8 mg/dL (ref 8.7–10.3)
Chloride: 102 mmol/L (ref 96–106)
Chol/HDL Ratio: 2.8 ratio (ref 0.0–4.4)
Cholesterol, Total: 226 mg/dL — ABNORMAL HIGH (ref 100–199)
Creatinine, Ser: 1.06 mg/dL — ABNORMAL HIGH (ref 0.57–1.00)
EOS (ABSOLUTE): 0.2 10*3/uL (ref 0.0–0.4)
Eos: 4 %
Estimated CHD Risk: <0.5 times avg. (ref 0.0–1.0)
Free Thyroxine Index: 1.7 (ref 1.2–4.9)
GFR calc Af Amer: 66 mL/min/{1.73_m2} (ref >59)
GFR calc non Af Amer: 57 mL/min/{1.73_m2} — ABNORMAL LOW (ref >59)
GGT: 81 IU/L — ABNORMAL HIGH (ref 0–60)
Globulin, Total: 2.3 g/dL (ref 1.5–4.5)
Glucose: 73 mg/dL (ref 65–99)
HDL: 81 mg/dL (ref >39)
Hematocrit: 36.5 % (ref 34.0–46.6)
Hemoglobin: 12.8 g/dL (ref 11.1–15.9)
Immature Grans (Abs): 0 10*3/uL (ref 0.0–0.1)
Immature Granulocytes: 0 %
Iron: 76 ug/dL (ref 27–159)
LDH: 303 IU/L — ABNORMAL HIGH (ref 119–226)
LDL Calculated: 133 mg/dL — ABNORMAL HIGH (ref 0–99)
Lymphocytes Absolute: 1.5 10*3/uL (ref 0.7–3.1)
Lymphs: 32 %
MCH: 32.3 pg (ref 26.6–33.0)
MCHC: 35.1 g/dL (ref 31.5–35.7)
MCV: 92 fL (ref 79–97)
Monocytes Absolute: 0.4 10*3/uL (ref 0.1–0.9)
Monocytes: 10 %
Neutrophils Absolute: 2.4 10*3/uL (ref 1.4–7.0)
Neutrophils: 53 %
Phosphorus: 3.5 mg/dL (ref 3.0–4.3)
Platelets: 295 10*3/uL (ref 150–450)
Potassium: 4.3 mmol/L (ref 3.5–5.2)
RBC: 3.96 x10E6/uL (ref 3.77–5.28)
RDW: 12.4 % (ref 11.7–15.4)
Sodium: 140 mmol/L (ref 134–144)
T3 Uptake Ratio: 24 % (ref 24–39)
T4, Total: 6.9 ug/dL (ref 4.5–12.0)
TSH: 4.37 u[IU]/mL (ref 0.450–4.500)
Total Protein: 6.8 g/dL (ref 6.0–8.5)
Triglycerides: 60 mg/dL (ref 0–149)
Uric Acid: 4.5 mg/dL (ref 2.5–7.1)
VLDL Cholesterol Cal: 12 mg/dL (ref 5–40)
WBC: 4.5 10*3/uL (ref 3.4–10.8)

## 2019-03-20 LAB — VITAMIN D 25 HYDROXY (VIT D DEFICIENCY, FRACTURES): Vit D, 25-Hydroxy: 36.8 ng/mL (ref 30.0–100.0)

## 2019-03-20 LAB — HGB A1C W/O EAG: Hgb A1c MFr Bld: 5.5 % (ref 4.8–5.6)

## 2019-08-30 ENCOUNTER — Other Ambulatory Visit: Payer: Self-pay | Admitting: Nurse Practitioner

## 2019-08-30 DIAGNOSIS — Z1231 Encounter for screening mammogram for malignant neoplasm of breast: Secondary | ICD-10-CM

## 2019-09-07 ENCOUNTER — Ambulatory Visit
Admission: RE | Admit: 2019-09-07 | Discharge: 2019-09-07 | Disposition: A | Discharge status: Home / Self Care | Payer: PRIVATE HEALTH INSURANCE | Source: Ambulatory Visit | Attending: Nurse Practitioner | Admitting: Nurse Practitioner

## 2019-09-07 DIAGNOSIS — Z1231 Encounter for screening mammogram for malignant neoplasm of breast: Secondary | ICD-10-CM | POA: Diagnosis present

## 2020-04-12 ENCOUNTER — Ambulatory Visit: Payer: PRIVATE HEALTH INSURANCE | Attending: Internal Medicine

## 2020-04-12 DIAGNOSIS — Z23 Encounter for immunization: Secondary | ICD-10-CM

## 2020-04-12 NOTE — Progress Notes (Signed)
   Covid-19 Vaccination Clinic  Name:  Krista English    MRN: [id number] DOB: [birth year]  04/12/2020  [name] was observed post Covid-19 immunization for 15 minutes without incident. She was provided with Vaccine Information Sheet and instruction to access the V-Safe system.   [name] was instructed to call 911 with any severe reactions post vaccine: Marland Kitchen Difficulty breathing  . Swelling of face and throat  . A fast heartbeat  . A bad rash all over body  . Dizziness and weakness   Immunizations Administered    Name Date Dose VIS Date Route   Pfizer COVID-19 Vaccine 04/12/2020  8:41 AM 0.3 mL 01/26/2019 Intramuscular   Manufacturer: [place]   Lot: [id number]   [place]: [id number]

## 2020-04-18 IMAGING — US US ABDOMEN LIMITED
1 series · 14 of 25 positions shown · non-contrast
Comparison: None.

CLINICAL DATA: 57-year-old female with a history of transaminitis

EXAM:
ULTRASOUND ABDOMEN LIMITED RIGHT UPPER QUADRANT

[Series 1: us abdomen limited · 0.22mm/px · 14 of 43 slices shown]
[im 1/43]
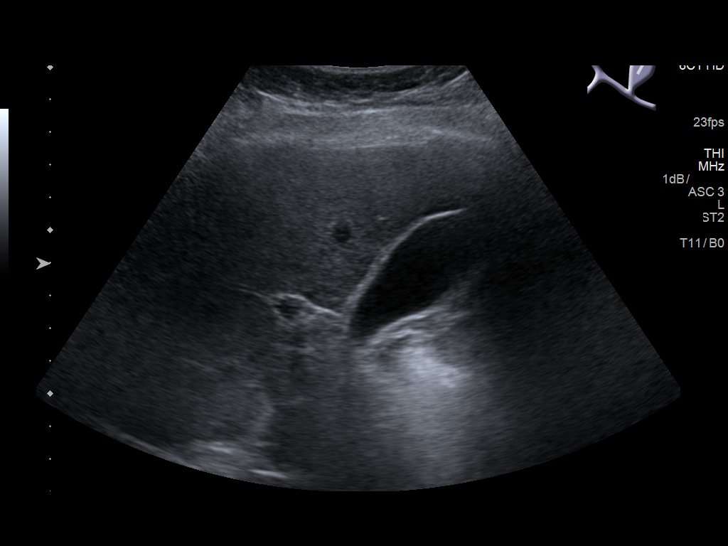
[im 4/43]
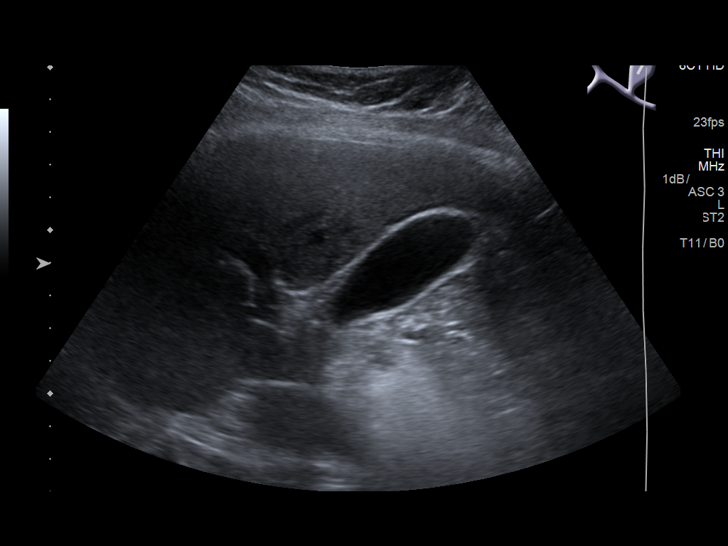
[im 8/43]
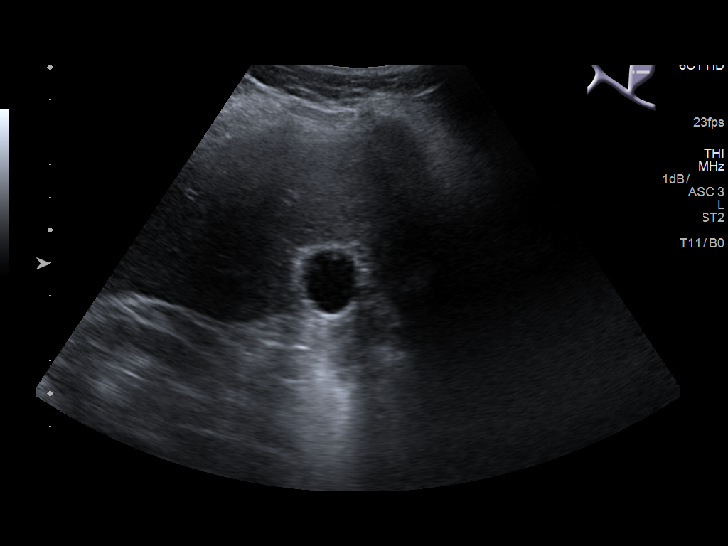
[im 11/43]
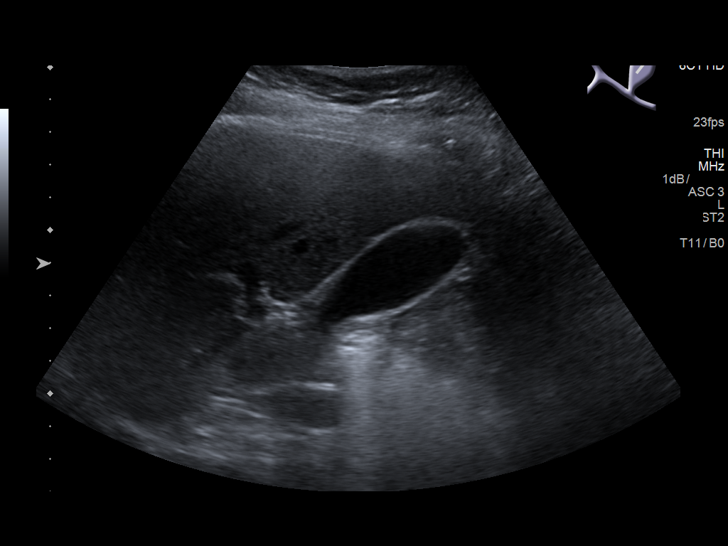
[im 15/43]
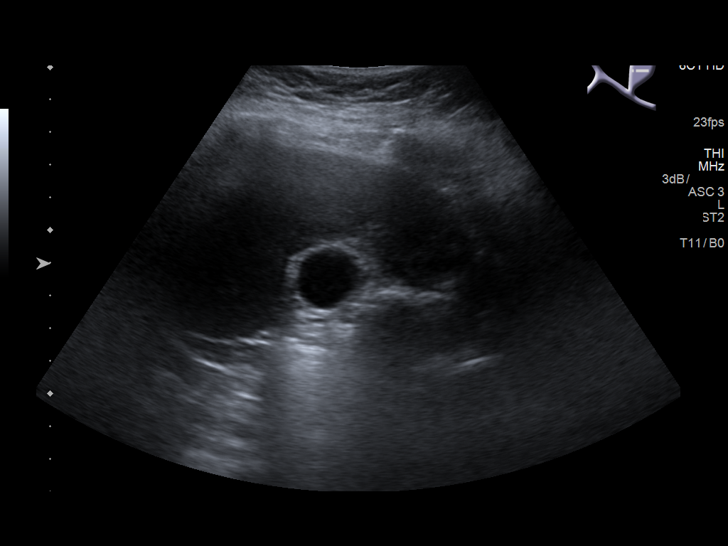
[im 16/43]
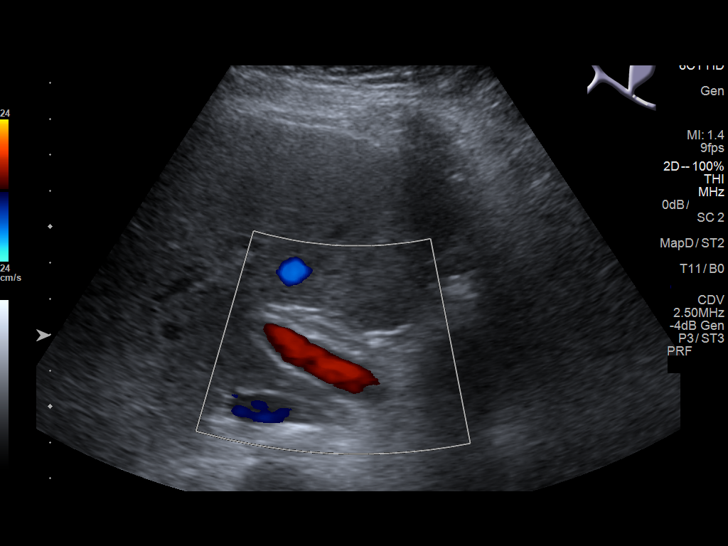
[im 20/43]
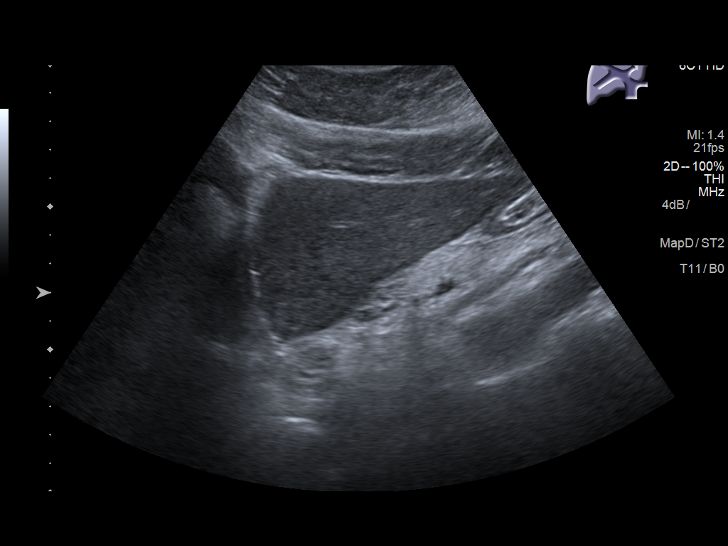
[im 23/43]
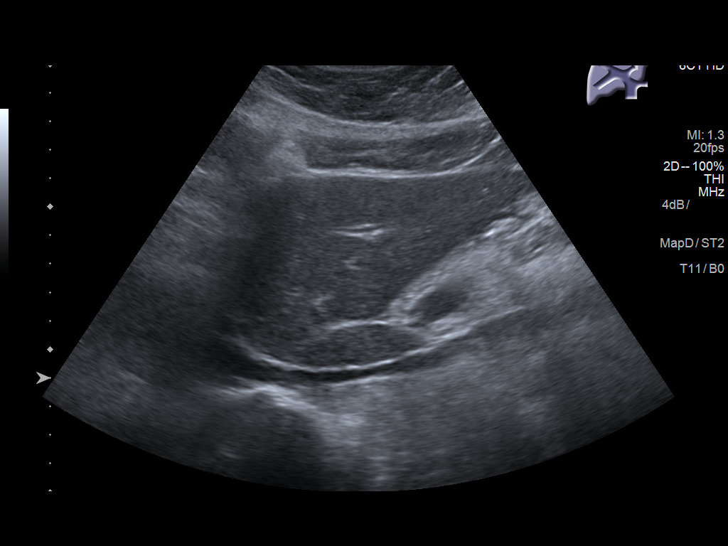
[im 27/43]
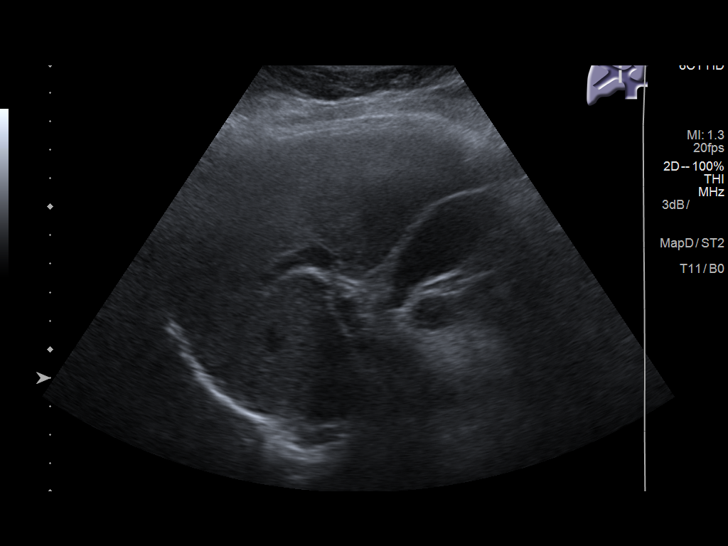
[im 29/43]
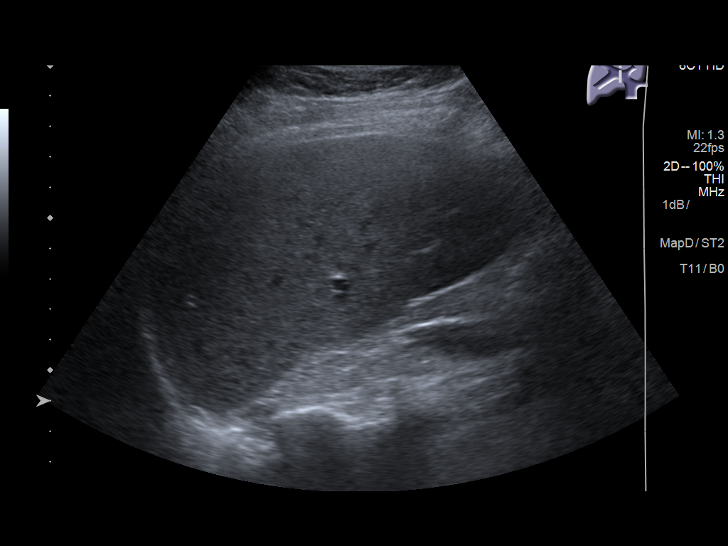
[im 32/43]
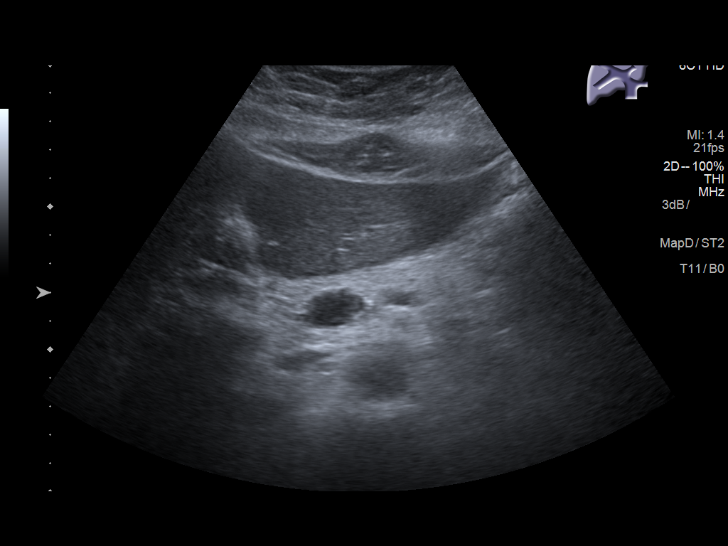
[im 36/43]
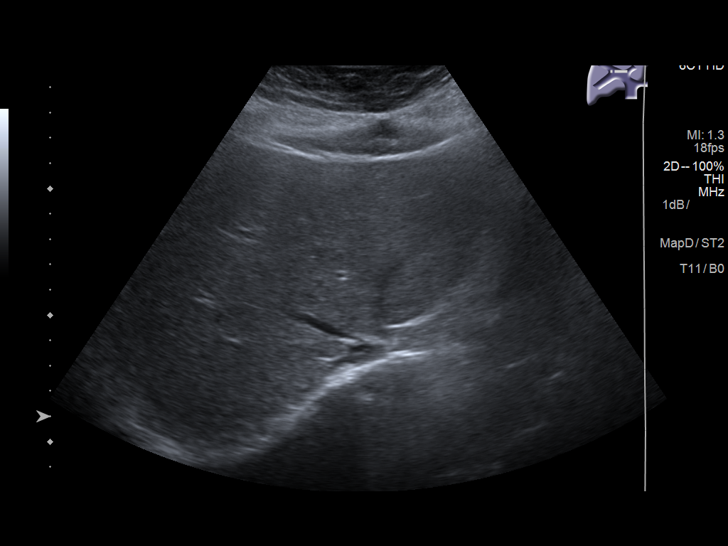
[im 39/43]
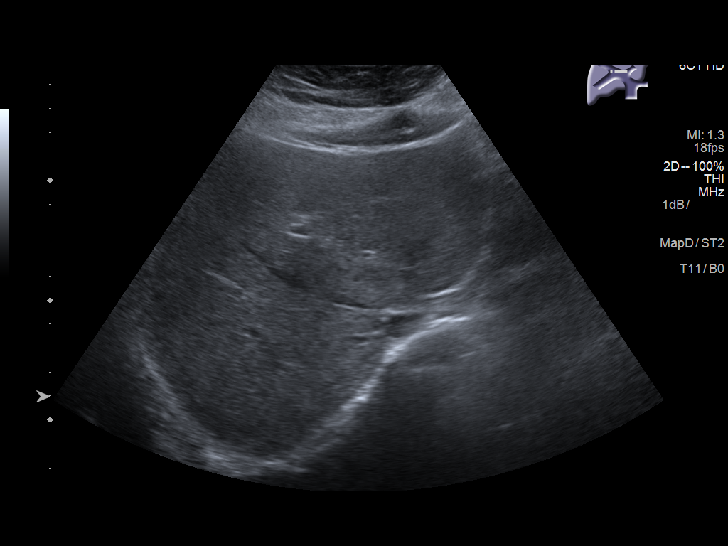
[im 43/43]
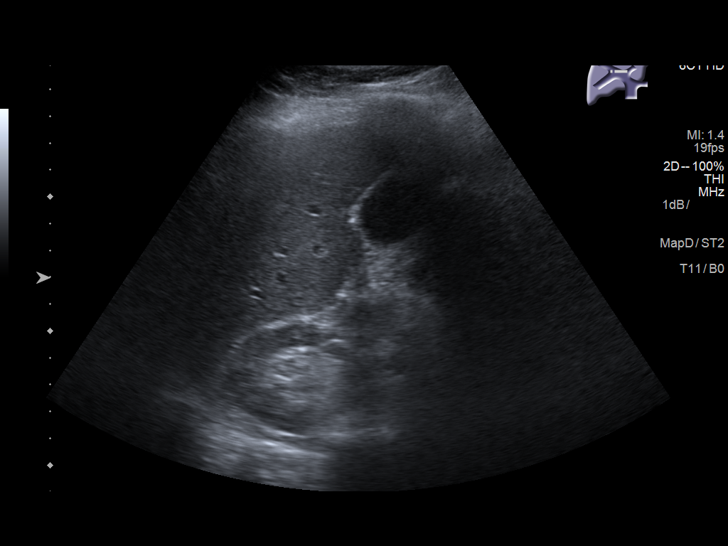

[14 of 25 positions shown; findings below may reference images not displayed]

FINDINGS: Gallbladder:

Hyperechoic focus on the anti dependent wall of the gallbladder
measures 3 mm. No significant echogenic material layered in the
dependent gallbladder. No sonographic Murphy sign. No
pericholecystic fluid or inflammatory wall thickening

Common bile duct:

Diameter: 1 mm-2 mm

Liver:

No focal lesion identified. Within normal limits in parenchymal
echogenicity. Portal vein is patent on color Doppler imaging with
normal direction of blood flow towards the liver.
IMPRESSION: Sonographic survey negative for cholelithiasis.

Relatively unremarkable appearance of the liver parenchyma.

Incidental 3 mm gallbladder polyp versus adherent stones/debris.
Follow-up ultrasound is recommended in 1 year.

## 2020-05-02 ENCOUNTER — Ambulatory Visit: Payer: PRIVATE HEALTH INSURANCE | Attending: Internal Medicine

## 2020-05-02 DIAGNOSIS — Z23 Encounter for immunization: Secondary | ICD-10-CM

## 2020-05-02 NOTE — Progress Notes (Signed)
   Covid-19 Vaccination Clinic  Name:  Krista English    MRN: [id number] DOB: 1961/01/25  05/02/2020  [name] was observed post Covid-19 immunization for 15 minutes without incident. She was provided with Vaccine Information Sheet and instruction to access the V-Safe system.   [name] was instructed to call 911 with any severe reactions post vaccine: Marland Kitchen Difficulty breathing  . Swelling of face and throat  . A fast heartbeat  . A bad rash all over body  . Dizziness and weakness   Immunizations Administered    Name Date Dose VIS Date Route   Pfizer COVID-19 Vaccine 05/02/2020  8:51 AM 0.3 mL 01/26/2019 Intramuscular   Manufacturer: [place]   Lot: [id number]   [place]: [id number]

## 2020-06-01 DIAGNOSIS — B009 Herpesviral infection, unspecified: Secondary | ICD-10-CM | POA: Insufficient documentation

## 2020-08-31 ENCOUNTER — Other Ambulatory Visit: Payer: Self-pay | Admitting: Nurse Practitioner

## 2020-08-31 DIAGNOSIS — Z1231 Encounter for screening mammogram for malignant neoplasm of breast: Secondary | ICD-10-CM

## 2020-09-07 ENCOUNTER — Other Ambulatory Visit: Payer: Self-pay

## 2020-09-07 ENCOUNTER — Ambulatory Visit
Admission: RE | Admit: 2020-09-07 | Discharge: 2020-09-07 | Disposition: A | Discharge status: Home / Self Care | Payer: BC Managed Care – PPO | Source: Ambulatory Visit | Attending: Nurse Practitioner | Admitting: Nurse Practitioner

## 2020-09-07 DIAGNOSIS — Z1231 Encounter for screening mammogram for malignant neoplasm of breast: Secondary | ICD-10-CM | POA: Insufficient documentation

## 2020-11-16 ENCOUNTER — Encounter: Payer: Self-pay | Admitting: *Deleted

## 2020-11-28 DIAGNOSIS — S82899A Other fracture of unspecified lower leg, initial encounter for closed fracture: Secondary | ICD-10-CM | POA: Insufficient documentation

## 2020-11-28 DIAGNOSIS — I10 Essential (primary) hypertension: Secondary | ICD-10-CM | POA: Insufficient documentation

## 2020-11-28 NOTE — Progress Notes (Signed)
Gastroenterology Pre-Procedure Review  Request Date: Friday 12/29/20  Requesting Physician: Dr. Maximino Greenland  PATIENT REVIEW QUESTIONS: The patient responded to the following health history questions as indicated:    1. Are you having any GI issues? no 2. Do you have a personal history of Polyps? no 3. Do you have a family history of Colon Cancer or Polyps? no 4. Diabetes Mellitus? no 5. Joint replacements in the past 12 months?no 6. Major health problems in the past 3 months?no 7. Any artificial heart valves, MVP, or defibrillator?no    MEDICATIONS & ALLERGIES:    Patient reports the following regarding taking any anticoagulation/antiplatelet therapy:   Plavix, Coumadin, Eliquis, Xarelto, Lovenox, Pradaxa, Brilinta, or Effient? no Aspirin? no  Patient confirms/reports the following medications:  Current Outpatient Medications  Medication Sig Dispense Refill  . amLODipine (NORVASC) 10 MG tablet TAKE 1 TABLET BY MOUTH DAILY FOR BP    . ascorbic Acid (VITAMIN C) 500 MG CPCR Vitamin C 500 MG Oral Capsule QTY: 0 capsule Days: 0 Refills: 0  Written: 06/01/20 Patient Instructions: 1 po qd    . Calcium Carbonate-Vit D-Min (CALTRATE 600+D PLUS MINERALS) 600-800 MG-UNIT CHEW Caltrate 600+D Plus Minerals 600-800 MG-UNIT Oral Tablet Chewable QTY: 0 tablet Days: 0 Refills: 0  Written: 06/01/20 Patient Instructions: 1 po qd    . cyclobenzaprine (FLEXERIL) 10 MG tablet Cyclobenzaprine HCl 10 MG Oral Tablet QTY: 30 tablet Days: 30 Refills: 5  Written: 01/01/19 Patient Instructions: Take 1 tablet by mouth nightly at bedtime as needed for spasms    . meclizine (ANTIVERT) 12.5 MG tablet Meclizine HCl 12.5 MG Oral Tablet QTY: 90 tablet Days: 30 Refills: 1  Written: 06/07/19 Patient Instructions: TAKE 1/2 TO 1 TABLET BY MOUTH EVERY 8 HOURS AS NEEDED FOR VERTIGO, PUSH WATER    . olmesartan (BENICAR) 20 MG tablet Olmesartan Medoxomil 20 MG Oral Tablet QTY: 30 tablet Days: 30 Refills: 6  Written: 11/09/20 Patient  Instructions: TAKE 1 TABLET BY MOUTH DAILY    . rosuvastatin (CRESTOR) 20 MG tablet Rosuvastatin Calcium 20 MG Oral Tablet QTY: 30 tablet Days: 30 Refills: 6  Written: 11/09/20 Patient Instructions: Take 1 tablet by mouth daily for high cholesterol    . valACYclovir (VALTREX) 1000 MG tablet valACYclovir HCl 1 GM Oral Tablet QTY: 4 tablet Days: 1 Refills: 5  Written: 06/01/20 Patient Instructions: Take 2 tablets by mouth x 1 dose at fever blister outbreak then take 2 tablets 12 hours later     No current facility-administered medications for this visit.    Patient confirms/reports the following allergies:  Allergies  Allergen Reactions  . Ace Inhibitors Other (See Comments) and Shortness Of Breath    Other reaction(s): Cough    Orders Placed This Encounter  Procedures  . Procedural/ Surgical Case Request: COLONOSCOPY WITH PROPOFOL    Standing Status:   Standing    Number of Occurrences:   1    Order Specific Question:   Pre-op diagnosis    Answer:   screening colonoscopy    Order Specific Question:   CPT Code    Answer:   58099    AUTHORIZATION INFORMATION Primary Insurance: 1D#: Group #:  Secondary Insurance: 1D#: Group #:  SCHEDULE INFORMATION: Date: 12/29/20 Time: Location: [facility]

## 2020-11-29 ENCOUNTER — Telehealth (INDEPENDENT_AMBULATORY_CARE_PROVIDER_SITE_OTHER): Payer: Self-pay | Admitting: Gastroenterology

## 2020-11-29 DIAGNOSIS — S63659A Sprain of metacarpophalangeal joint of unspecified finger, initial encounter: Secondary | ICD-10-CM | POA: Insufficient documentation

## 2020-11-29 DIAGNOSIS — Z1211 Encounter for screening for malignant neoplasm of colon: Secondary | ICD-10-CM

## 2020-11-29 MED ORDER — NA SULFATE-K SULFATE-MG SULF 17.5-3.13-1.6 GM/177ML PO SOLN: 1.0000 | Freq: Once | ORAL | 0 refills | Status: AC

## 2020-12-27 ENCOUNTER — Other Ambulatory Visit
Admission: RE | Admit: 2020-12-27 | Discharge: 2020-12-27 | Disposition: A | Discharge status: Home / Self Care | Payer: BC Managed Care – PPO | Source: Ambulatory Visit | Attending: Gastroenterology | Admitting: Gastroenterology

## 2020-12-27 ENCOUNTER — Other Ambulatory Visit: Payer: Self-pay

## 2020-12-27 DIAGNOSIS — Z888 Allergy status to other drugs, medicaments and biological substances status: Secondary | ICD-10-CM | POA: Diagnosis not present

## 2020-12-27 DIAGNOSIS — Z1211 Encounter for screening for malignant neoplasm of colon: Secondary | ICD-10-CM | POA: Diagnosis not present

## 2020-12-27 DIAGNOSIS — K648 Other hemorrhoids: Secondary | ICD-10-CM | POA: Diagnosis not present

## 2020-12-27 DIAGNOSIS — Z79899 Other long term (current) drug therapy: Secondary | ICD-10-CM | POA: Diagnosis not present

## 2020-12-27 DIAGNOSIS — D124 Benign neoplasm of descending colon: Secondary | ICD-10-CM | POA: Diagnosis not present

## 2020-12-27 DIAGNOSIS — Z01812 Encounter for preprocedural laboratory examination: Secondary | ICD-10-CM | POA: Insufficient documentation

## 2020-12-27 DIAGNOSIS — Z20822 Contact with and (suspected) exposure to covid-19: Secondary | ICD-10-CM | POA: Insufficient documentation

## 2020-12-27 DIAGNOSIS — Z87891 Personal history of nicotine dependence: Secondary | ICD-10-CM | POA: Diagnosis not present

## 2020-12-27 LAB — SARS CORONAVIRUS 2 (TAT 6-24 HRS): SARS Coronavirus 2: NEGATIVE

## 2020-12-29 ENCOUNTER — Encounter
Admission: RE | Disposition: A | Discharge status: Home / Self Care | Payer: Self-pay | Source: Home / Self Care | Attending: Gastroenterology

## 2020-12-29 ENCOUNTER — Ambulatory Visit
Admission: RE | Admit: 2020-12-29 | Discharge: 2020-12-29 | Disposition: A | Discharge status: Home / Self Care | Payer: BC Managed Care – PPO | Attending: Gastroenterology | Admitting: Gastroenterology

## 2020-12-29 ENCOUNTER — Other Ambulatory Visit: Payer: Self-pay

## 2020-12-29 ENCOUNTER — Encounter: Payer: Self-pay | Admitting: Gastroenterology

## 2020-12-29 ENCOUNTER — Ambulatory Visit: Payer: BC Managed Care – PPO | Admitting: Anesthesiology

## 2020-12-29 DIAGNOSIS — Z1211 Encounter for screening for malignant neoplasm of colon: Secondary | ICD-10-CM

## 2020-12-29 DIAGNOSIS — Z888 Allergy status to other drugs, medicaments and biological substances status: Secondary | ICD-10-CM | POA: Insufficient documentation

## 2020-12-29 DIAGNOSIS — D124 Benign neoplasm of descending colon: Secondary | ICD-10-CM | POA: Insufficient documentation

## 2020-12-29 DIAGNOSIS — Z20822 Contact with and (suspected) exposure to covid-19: Secondary | ICD-10-CM | POA: Insufficient documentation

## 2020-12-29 DIAGNOSIS — Z79899 Other long term (current) drug therapy: Secondary | ICD-10-CM | POA: Insufficient documentation

## 2020-12-29 DIAGNOSIS — K648 Other hemorrhoids: Secondary | ICD-10-CM | POA: Insufficient documentation

## 2020-12-29 DIAGNOSIS — K635 Polyp of colon: Secondary | ICD-10-CM | POA: Diagnosis not present

## 2020-12-29 DIAGNOSIS — Z01812 Encounter for preprocedural laboratory examination: Secondary | ICD-10-CM | POA: Insufficient documentation

## 2020-12-29 DIAGNOSIS — Z87891 Personal history of nicotine dependence: Secondary | ICD-10-CM | POA: Insufficient documentation

## 2020-12-29 HISTORY — DX: Essential (primary) hypertension: I10

## 2020-12-29 HISTORY — PX: COLONOSCOPY WITH PROPOFOL: SHX5780

## 2020-12-29 HISTORY — DX: Gastro-esophageal reflux disease without esophagitis: K21.9

## 2020-12-29 HISTORY — DX: Pure hypercholesterolemia, unspecified: E78.00

## 2020-12-29 SURGERY — COLONOSCOPY WITH PROPOFOL
Anesthesia: General

## 2020-12-29 MED ORDER — PROPOFOL 10 MG/ML IV BOLUS
INTRAVENOUS | Status: DC | PRN
  Administered 2020-12-29: 100 mg via INTRAVENOUS

## 2020-12-29 MED ORDER — SODIUM CHLORIDE 0.9 % IV SOLN: INTRAVENOUS | Status: DC

## 2020-12-29 MED ORDER — LIDOCAINE HCL (CARDIAC) PF 100 MG/5ML IV SOSY
PREFILLED_SYRINGE | INTRAVENOUS | Status: DC | PRN
  Administered 2020-12-29: 100 mg via INTRAVENOUS

## 2020-12-29 MED ORDER — PROPOFOL 500 MG/50ML IV EMUL
INTRAVENOUS | Status: DC | PRN
  Administered 2020-12-29: 120 ug/kg/min via INTRAVENOUS

## 2020-12-29 NOTE — Anesthesia Postprocedure Evaluation (Signed)
Anesthesia Post Note  Patient: Krista English  Procedure(s) Performed: COLONOSCOPY WITH PROPOFOL (N/A )  Patient location during evaluation: Endoscopy Anesthesia Type: General Level of consciousness: awake and alert Pain management: pain level controlled Vital Signs Assessment: post-procedure vital signs reviewed and stable Respiratory status: spontaneous breathing, nonlabored ventilation, respiratory function stable and patient connected to nasal cannula oxygen Cardiovascular status: blood pressure returned to baseline and stable Postop Assessment: no apparent nausea or vomiting Anesthetic complications: no   No complications documented.   Last Vitals:  Vitals:   12/29/20 1140 12/29/20 1150  BP: (!) 88/73 123/76  Pulse: (!) 58 (!) 57  Resp: (!) 22 17  Temp:    SpO2: 100% 100%    Last Pain:  Vitals:   12/29/20 1120  TempSrc: Temporal  PainSc:                  Martha Clan

## 2020-12-29 NOTE — Anesthesia Preprocedure Evaluation (Signed)
Anesthesia Evaluation  Patient identified by MRN, date of birth, ID band Patient awake    Reviewed: Allergy & Precautions, H&P , NPO status , Patient's Chart, lab work & pertinent test results, reviewed documented beta blocker date and time   History of Anesthesia Complications Negative for: history of anesthetic complications  Airway Mallampati: II  TM Distance: >3 FB Neck ROM: full    Dental  (+) Dental Advidsory Given, Teeth Intact, Missing   Pulmonary neg pulmonary ROS, former smoker,    Pulmonary exam normal breath sounds clear to auscultation       Cardiovascular Exercise Tolerance: Good hypertension, (-) angina(-) Past MI and (-) Cardiac Stents Normal cardiovascular exam(-) dysrhythmias (-) Valvular Problems/Murmurs Rhythm:regular Rate:Normal     Neuro/Psych negative neurological ROS  negative psych ROS   GI/Hepatic Neg liver ROS, GERD  ,  Endo/Other  negative endocrine ROS  Renal/GU negative Renal ROS  negative genitourinary   Musculoskeletal   Abdominal   Peds  Hematology negative hematology ROS (+)   Anesthesia Other Findings Past Medical History: No date: GERD (gastroesophageal reflux disease) No date: High cholesterol No date: Hypertension   Reproductive/Obstetrics negative OB ROS                             Anesthesia Physical Anesthesia Plan  ASA: II  Anesthesia Plan: General   Post-op Pain Management:    Induction: Intravenous  PONV Risk Score and Plan: 3 and TIVA and Propofol infusion  Airway Management Planned: Natural Airway and Nasal Cannula  Additional Equipment:   Intra-op Plan:   Post-operative Plan:   Informed Consent: I have reviewed the patients History and Physical, chart, labs and discussed the procedure including the risks, benefits and alternatives for the proposed anesthesia with the patient or authorized representative who has indicated  his/her understanding and acceptance.     Dental Advisory Given  Plan Discussed with: Anesthesiologist, CRNA and Surgeon  Anesthesia Plan Comments:         Anesthesia Quick Evaluation

## 2020-12-29 NOTE — Transfer of Care (Signed)
Immediate Anesthesia Transfer of Care Note  Patient: Kaidynce Pfister  Procedure(s) Performed: COLONOSCOPY WITH PROPOFOL (N/A )  Patient Location: PACU and Endoscopy Unit  Anesthesia Type:General  Level of Consciousness: awake, drowsy and patient cooperative  Airway & Oxygen Therapy: Patient Spontanous Breathing  Post-op Assessment: Report given to RN and Post -op Vital signs reviewed and stable  Post vital signs: Reviewed and stable  Last Vitals:  Vitals Value Taken Time  BP 124/70 12/29/20 1124  Temp    Pulse 71 12/29/20 1125  Resp 14 12/29/20 1125  SpO2 100 % 12/29/20 1125  Vitals shown include unvalidated device data.  Last Pain:  Vitals:   12/29/20 0927  TempSrc: Temporal  PainSc: 0-No pain         Complications: No complications documented.

## 2020-12-29 NOTE — Op Note (Signed)
[facility] Gastroenterology Patient Name: Krista English Procedure Date: 12/29/2020 10:48 AM MRN: [id number] Account #: [id number] Date of Birth: [birth year] Admit Type: Outpatient Age: 60 Room: [facility] ENDO ROOM 2 Gender: Female Note Status: Finalized Procedure:             Colonoscopy Indications:           Screening for colorectal malignant neoplasm Providers:             Eiley Mcginnity B. Bonna Gains MD, MD Referring MD:          Forest Gleason Md, MD (Referring MD) Medicines:             Monitored Anesthesia Care Complications:         No immediate complications. Procedure:             Pre-Anesthesia Assessment:                        - ASA Grade Assessment: II - A patient with mild                         systemic disease.                        - Prior to the procedure, a History and Physical was                         performed, and patient medications, allergies and                         sensitivities were reviewed. The patient's tolerance                         of previous anesthesia was reviewed.                        - The risks and benefits of the procedure and the                         sedation options and risks were discussed with the                         patient. All questions were answered and informed                         consent was obtained.                        - Patient identification and proposed procedure were                         verified prior to the procedure by the physician, the                         nurse, the anesthesiologist, the anesthetist and the                         technician. The procedure was verified in the                         procedure  room.                        After obtaining informed consent, the colonoscope was                         passed under direct vision. Throughout the procedure,                         the patient's blood pressure, pulse, and oxygen                         saturations were monitored  continuously. The                         Colonoscope was introduced through the anus and                         advanced to the the cecum, identified by appendiceal                         orifice and ileocecal valve. The colonoscopy was                         performed with ease. The patient tolerated the                         procedure well. The quality of the bowel preparation                         was good. Findings:      The perianal and digital rectal examinations were normal.      A 5 mm polyp was found in the descending colon. The polyp was sessile.       The polyp was removed with a cold snare. Resection and retrieval were       complete.      The exam was otherwise without abnormality.      The rectum, sigmoid colon, descending colon, transverse colon, ascending       colon and cecum appeared normal.      Non-bleeding internal hemorrhoids were found during retroflexion. Impression:            - One 5 mm polyp in the descending colon, removed with                         a cold snare. Resected and retrieved.                        - The examination was otherwise normal.                        - The rectum, sigmoid colon, descending colon,                         transverse colon, ascending colon and cecum are normal.                        - Non-bleeding internal hemorrhoids. Recommendation:        - Discharge patient to home (with escort).                        -  Advance diet as tolerated.                        - Continue present medications.                        - Await pathology results.                        - Repeat colonoscopy date to be determined after                         pending pathology results are reviewed.                        - The findings and recommendations were discussed with                         the patient.                        - The findings and recommendations were discussed with                         the patient's family.                         - Return to primary care physician as previously                         scheduled.                        - High fiber diet. Procedure Code(s):     --- Professional ---                        [phone], Colonoscopy, flexible; with removal of                         tumor(s), polyp(s), or other lesion(s) by snare                         technique Diagnosis Code(s):     --- Professional ---                        Z12.11, Encounter for screening for malignant neoplasm                         of colon                        K63.5, Polyp of colon CPT copyright 2019 American Medical Association. All rights reserved. The codes documented in this report are preliminary and upon coder review may  be revised to meet current compliance requirements.  Vonda Antigua, MD Margretta Sidle B. Bonna Gains MD, MD 12/29/2020 11:21:03 AM This report has been signed electronically. Number of Addenda: 0 Note Initiated On: 12/29/2020 10:48 AM Scope Withdrawal Time: 0 hours 11 minutes 58 seconds  Total Procedure Duration: 0 hours 19 minutes 55 seconds  Estimated Blood Loss:  Estimated blood loss: none.      [facility]

## 2020-12-29 NOTE — H&P (Signed)
Vonda Antigua, MD [address], [place], [place], [place], [address] [address], [place], [place], [place], [address] Phone: [phone]  Fax: [phone]  Primary Care Physician:  Danelle Berry, NP   Pre-Procedure History & Physical: HPI:  Krista English is a 60 y.o. female is here for a colonoscopy.   Past Medical History:  Diagnosis Date  . GERD (gastroesophageal reflux disease)   . High cholesterol   . Hypertension     Past Surgical History:  Procedure Laterality Date  . CESAREAN SECTION      Prior to Admission medications   Medication Sig Start Date End Date Taking? Authorizing Provider  ascorbic Acid (VITAMIN C) 500 MG CPCR Vitamin C 500 MG Oral Capsule QTY: 0 capsule Days: 0 Refills: 0  Written: 06/01/20 Patient Instructions: 1 po qd 06/01/20  Yes [provider]  Calcium Carbonate-Vit D-Min (CALTRATE 600+D PLUS MINERALS) 600-800 MG-UNIT CHEW Caltrate 600+D Plus Minerals 600-800 MG-UNIT Oral Tablet Chewable QTY: 0 tablet Days: 0 Refills: 0  Written: 06/01/20 Patient Instructions: 1 po qd 06/01/20  Yes [provider]  cetirizine (ZYRTEC) 10 MG tablet Take 10 mg by mouth daily. 11/09/20  Yes [provider]  cyclobenzaprine (FLEXERIL) 10 MG tablet Cyclobenzaprine HCl 10 MG Oral Tablet QTY: 30 tablet Days: 30 Refills: 5  Written: 01/01/19 Patient Instructions: Take 1 tablet by mouth nightly at bedtime as needed for spasms 01/01/19  Yes [provider]  hydrochlorothiazide (HYDRODIURIL) 25 MG tablet Take 25 mg by mouth daily. 11/09/20  Yes [provider]  meclizine (ANTIVERT) 12.5 MG tablet Meclizine HCl 12.5 MG Oral Tablet QTY: 90 tablet Days: 30 Refills: 1  Written: 06/07/19 Patient Instructions: TAKE 1/2 TO 1 TABLET BY MOUTH EVERY 8 HOURS AS NEEDED FOR VERTIGO, PUSH WATER 06/07/19  Yes [provider]  olmesartan (BENICAR) 20 MG tablet Olmesartan Medoxomil 20 MG Oral Tablet QTY: 30 tablet Days: 30 Refills: 6  Written:  11/09/20 Patient Instructions: TAKE 1 TABLET BY MOUTH DAILY 11/09/20  Yes [provider]  rosuvastatin (CRESTOR) 20 MG tablet Rosuvastatin Calcium 20 MG Oral Tablet QTY: 30 tablet Days: 30 Refills: 6  Written: 11/09/20 Patient Instructions: Take 1 tablet by mouth daily for high cholesterol 11/09/20  Yes [provider]  amLODipine (NORVASC) 10 MG tablet TAKE 1 TABLET BY MOUTH DAILY FOR BP Patient not taking: No sig reported 10/27/17   [provider]  valACYclovir (VALTREX) 1000 MG tablet valACYclovir HCl 1 GM Oral Tablet QTY: 4 tablet Days: 1 Refills: 5  Written: 06/01/20 Patient Instructions: Take 2 tablets by mouth x 1 dose at fever blister outbreak then take 2 tablets 12 hours later Patient not taking: Reported on 12/29/2020 06/01/20   [provider]    Allergies as of 11/28/2020 - Review Complete 10/14/2018  Allergen Reaction Noted  . Ace inhibitors Other (See Comments) and Shortness Of Breath 07/01/2016    Family History  Problem Relation Age of Onset  . Breast cancer Neg Hx     Social History   Socioeconomic History  . Marital status: Single    Spouse name: Not on file  . Number of children: Not on file  . Years of education: Not on file  . Highest education level: Not on file  Occupational History  . Not on file  Tobacco Use  . Smoking status: Former Smoker    Quit date: 12/02/1998    Years since quitting: 22.0  . Smokeless tobacco: Never Used  Vaping Use  .  Vaping Use: Never used  Substance and Sexual Activity  . Alcohol use: Yes    Comment: occasional  . Drug use: Never  . Sexual activity: Not on file  Other Topics Concern  . Not on file  Social History Narrative  . Not on file   Social Determinants of Health   Financial Resource Strain: Not on file  Food Insecurity: Not on file  Transportation Needs: Not on file  Physical Activity: Not on file  Stress: Not on file  Social Connections: Not on file  Intimate Partner  Violence: Not on file    Review of Systems: See HPI, otherwise negative ROS  Physical Exam: BP (!) 152/88   Pulse 64   Temp (!) 96.8 F (36 C) (Temporal)   Resp 18   Ht 5\' 4"  (1.626 m)   Wt 74.8 kg   SpO2 100%   BMI 28.32 kg/m  General:   Alert,  pleasant and cooperative in NAD Head:  Normocephalic and atraumatic. Neck:  Supple; no masses or thyromegaly. Lungs:  Clear throughout to auscultation, normal respiratory effort.    Heart:  +S1, +S2, Regular rate and rhythm, No edema. Abdomen:  Soft, nontender and nondistended. Normal bowel sounds, without guarding, and without rebound.   Neurologic:  Alert and  oriented x4;  grossly normal neurologically.  Impression/Plan: Krista English is here for a colonoscopy to be performed for average risk screening.  Risks, benefits, limitations, and alternatives regarding  colonoscopy have been reviewed with the patient.  Questions have been answered.  All parties agreeable.   Virgel Manifold, MD  12/29/2020, 10:51 AM

## 2021-01-01 ENCOUNTER — Encounter: Payer: Self-pay | Admitting: Gastroenterology

## 2021-01-01 LAB — SURGICAL PATHOLOGY

## 2021-01-02 ENCOUNTER — Encounter: Payer: Self-pay | Admitting: Gastroenterology

## 2021-09-10 ENCOUNTER — Ambulatory Visit
Admission: RE | Admit: 2021-09-10 | Discharge: 2021-09-10 | Disposition: A | Discharge status: Home / Self Care | Payer: BC Managed Care – PPO | Source: Ambulatory Visit | Attending: Nurse Practitioner | Admitting: Nurse Practitioner

## 2021-09-10 ENCOUNTER — Other Ambulatory Visit: Payer: Self-pay

## 2021-09-10 ENCOUNTER — Other Ambulatory Visit: Payer: Self-pay | Admitting: Nurse Practitioner

## 2021-09-10 DIAGNOSIS — Z1231 Encounter for screening mammogram for malignant neoplasm of breast: Secondary | ICD-10-CM

## 2022-07-13 IMAGING — MG MM DIGITAL SCREENING BILAT W/ TOMO AND CAD
8 series · 8 of 24 positions shown · non-contrast
Comparison: Previous exam(s).

CLINICAL DATA: Screening.

EXAM:
DIGITAL SCREENING BILATERAL MAMMOGRAM WITH TOMOSYNTHESIS AND CAD
TECHNIQUE: Bilateral screening digital craniocaudal and mediolateral oblique
mammograms were obtained. Bilateral screening digital breast
tomosynthesis was performed. The images were evaluated with
computer-aided detection.

[L MLO synth-2D]
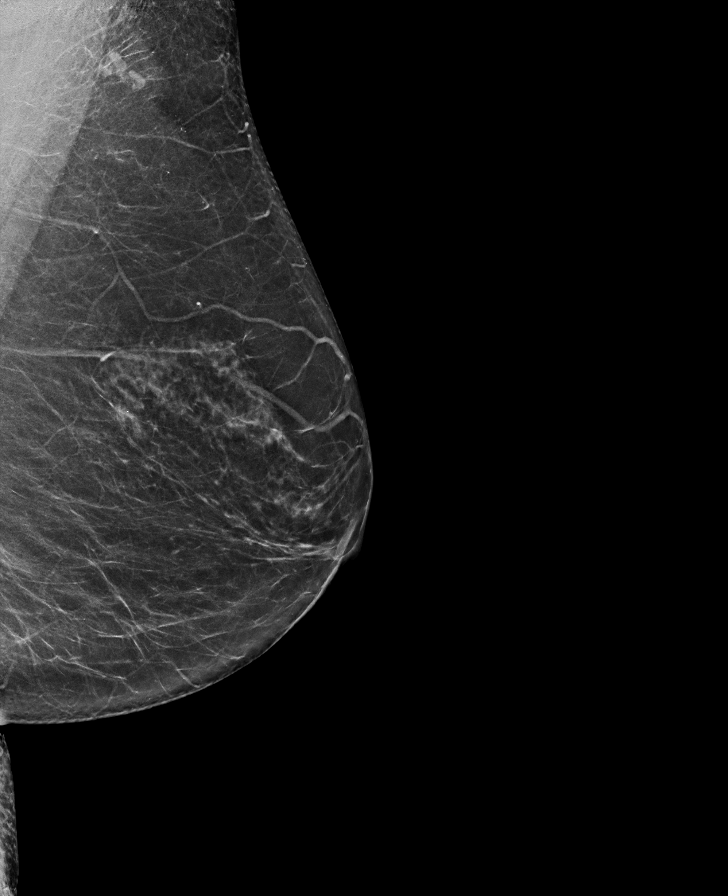

[R MLO synth-2D]
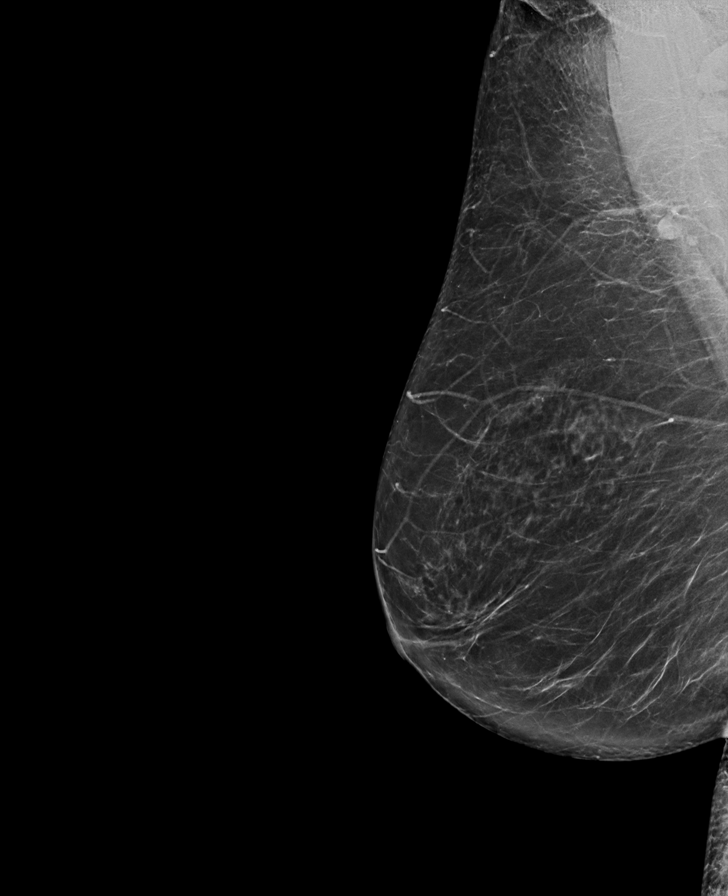

[L CC synth-2D]
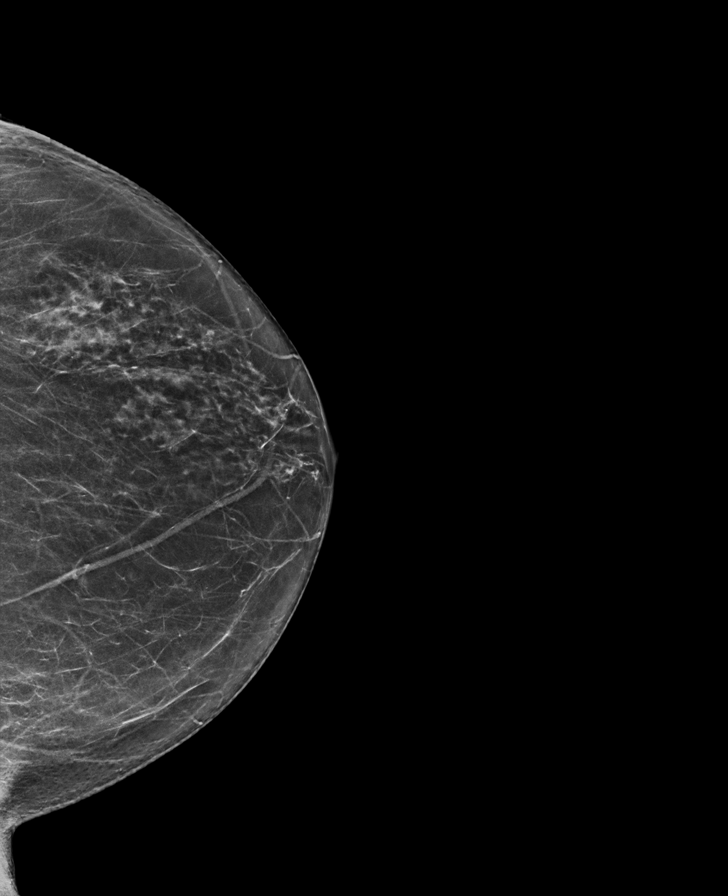

[R CC synth-2D]
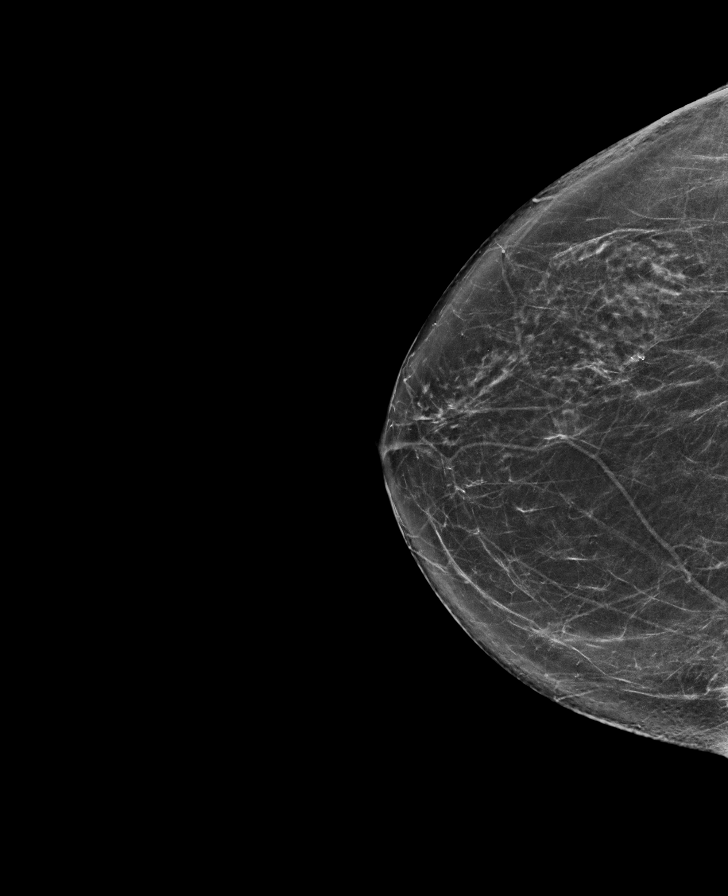

[R MLO tomo · tomo slice 37/73.0]
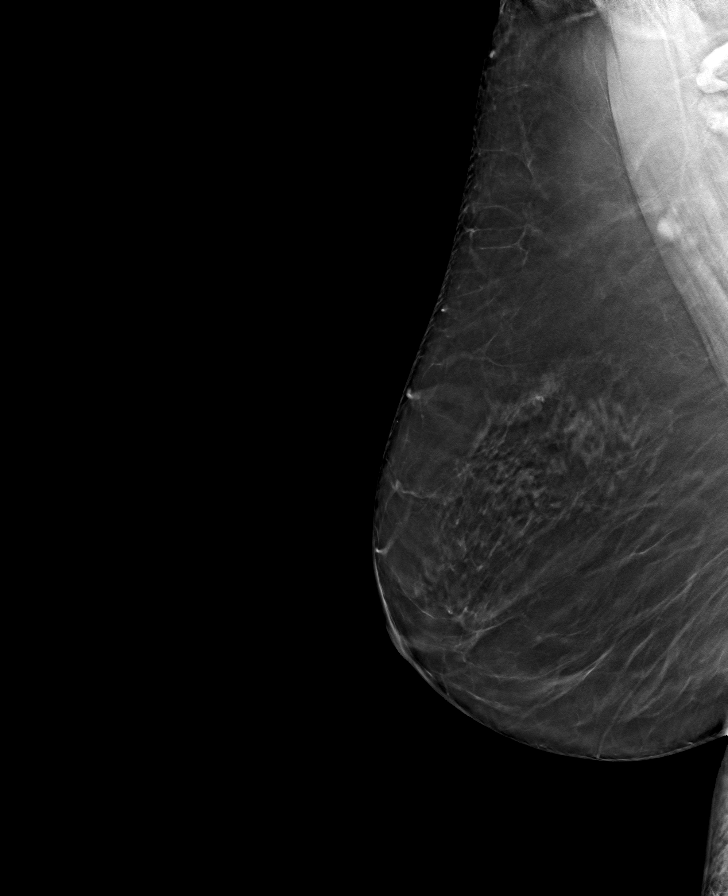

[R CC tomo · tomo slice 38/75.0]
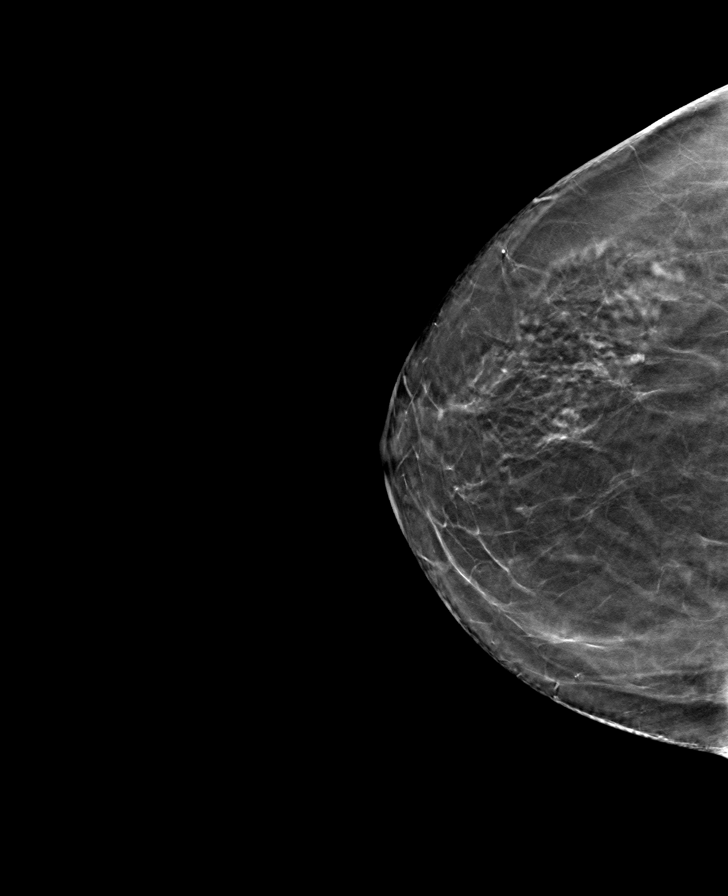

[L MLO tomo · tomo slice 35/68.0]
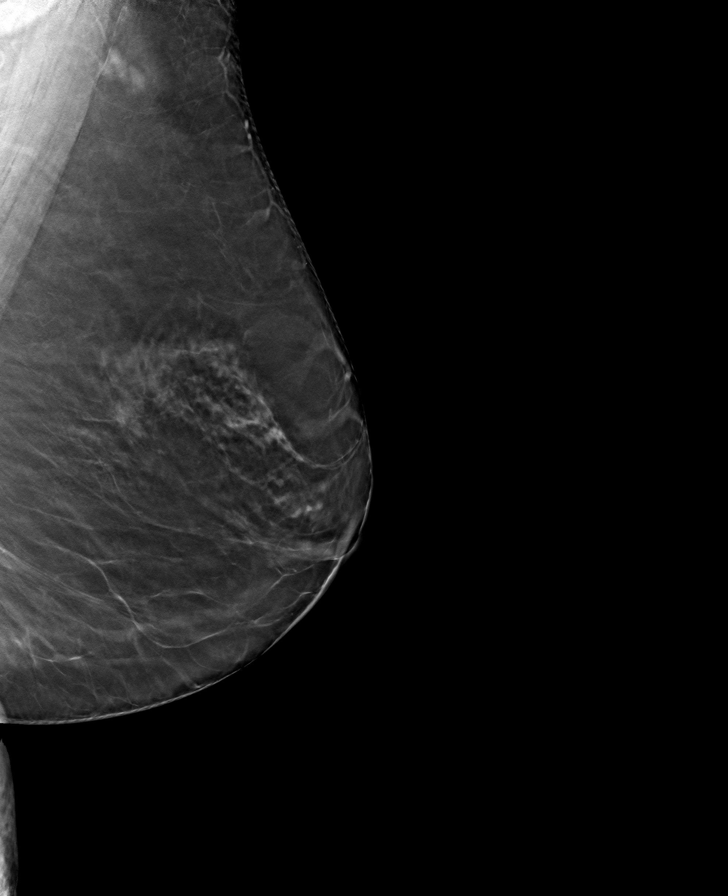

[L CC tomo · tomo slice 33/66.0]
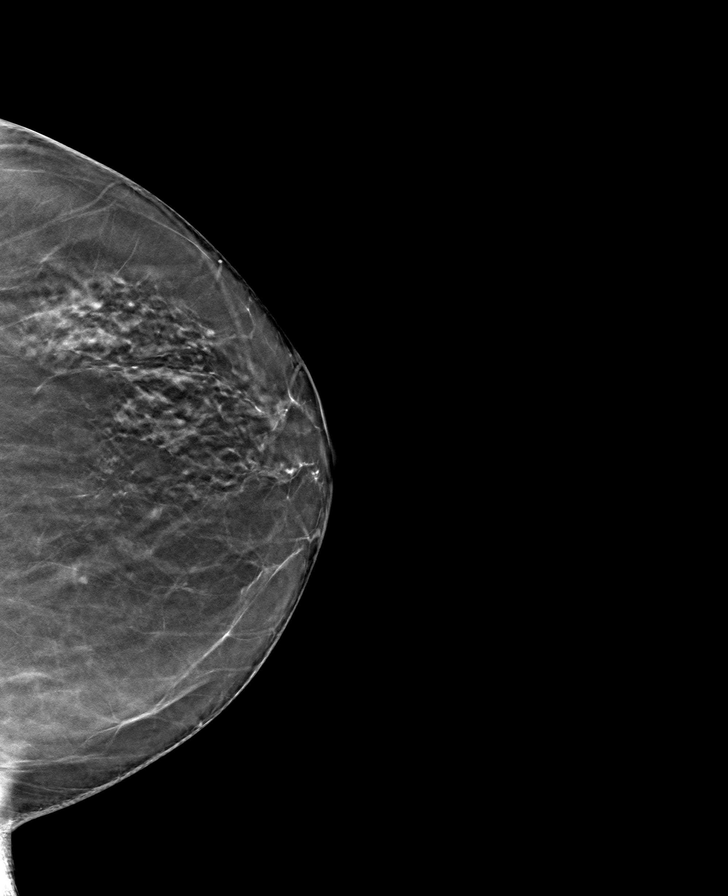

[8 of 24 positions shown; findings below may reference images not displayed]

ACR Breast Density Category b: There are scattered areas of
fibroglandular density.
FINDINGS: There are no findings suspicious for malignancy.
IMPRESSION: No mammographic evidence of malignancy. A result letter of this
screening mammogram will be mailed directly to the patient.

RECOMMENDATION:
Screening mammogram in one year. (Code:51-O-LD2)

BI-RADS CATEGORY  1: Negative.

## 2022-09-30 ENCOUNTER — Other Ambulatory Visit: Payer: Self-pay | Admitting: Nurse Practitioner

## 2022-09-30 DIAGNOSIS — Z1231 Encounter for screening mammogram for malignant neoplasm of breast: Secondary | ICD-10-CM

## 2022-10-14 ENCOUNTER — Ambulatory Visit
Admission: RE | Admit: 2022-10-14 | Discharge: 2022-10-14 | Disposition: A | Discharge status: Home / Self Care | Payer: BC Managed Care – PPO | Source: Ambulatory Visit | Attending: Nurse Practitioner | Admitting: Nurse Practitioner

## 2022-10-14 DIAGNOSIS — Z1231 Encounter for screening mammogram for malignant neoplasm of breast: Secondary | ICD-10-CM

## 2023-01-08 ENCOUNTER — Other Ambulatory Visit: Payer: Self-pay | Admitting: Nurse Practitioner

## 2023-01-08 DIAGNOSIS — M8589 Other specified disorders of bone density and structure, multiple sites: Secondary | ICD-10-CM

## 2023-02-07 ENCOUNTER — Other Ambulatory Visit: Payer: Self-pay | Admitting: Nurse Practitioner

## 2023-02-12 ENCOUNTER — Other Ambulatory Visit: Payer: BC Managed Care – PPO

## 2023-02-12 DIAGNOSIS — I10 Essential (primary) hypertension: Secondary | ICD-10-CM

## 2023-02-12 DIAGNOSIS — E785 Hyperlipidemia, unspecified: Secondary | ICD-10-CM

## 2023-02-12 DIAGNOSIS — R7303 Prediabetes: Secondary | ICD-10-CM

## 2023-02-13 LAB — CMP14+EGFR
ALT: 20 IU/L (ref 0–32)
AST: 22 IU/L (ref 0–40)
Albumin/Globulin Ratio: 1.5 (ref 1.2–2.2)
Albumin: 4.2 g/dL (ref 3.9–4.9)
Alkaline Phosphatase: 73 IU/L (ref 44–121)
BUN/Creatinine Ratio: 23 (ref 12–28)
BUN: 22 mg/dL (ref 8–27)
Bilirubin Total: 0.3 mg/dL (ref 0.0–1.2)
CO2: 24 mmol/L (ref 20–29)
Calcium: 10 mg/dL (ref 8.7–10.3)
Chloride: 101 mmol/L (ref 96–106)
Creatinine, Ser: 0.96 mg/dL (ref 0.57–1.00)
Globulin, Total: 2.8 g/dL (ref 1.5–4.5)
Glucose: 90 mg/dL (ref 70–99)
Potassium: 4.4 mmol/L (ref 3.5–5.2)
Sodium: 139 mmol/L (ref 134–144)
Total Protein: 7 g/dL (ref 6.0–8.5)
eGFR: 66 mL/min/{1.73_m2} (ref >59)

## 2023-02-13 LAB — LIPID PANEL
Chol/HDL Ratio: 2.9 ratio (ref 0.0–4.4)
Cholesterol, Total: 192 mg/dL (ref 100–199)
HDL: 67 mg/dL (ref >39)
LDL Chol Calc (NIH): 111 mg/dL — ABNORMAL HIGH (ref 0–99)
Triglycerides: 77 mg/dL (ref 0–149)
VLDL Cholesterol Cal: 14 mg/dL (ref 5–40)

## 2023-02-13 LAB — CBC WITH DIFFERENTIAL
Basophils Absolute: 0 10*3/uL (ref 0.0–0.2)
Basos: 0 %
EOS (ABSOLUTE): 0.1 10*3/uL (ref 0.0–0.4)
Eos: 2 %
Hematocrit: 37.5 % (ref 34.0–46.6)
Hemoglobin: 12.6 g/dL (ref 11.1–15.9)
Immature Grans (Abs): 0 10*3/uL (ref 0.0–0.1)
Immature Granulocytes: 0 %
Lymphocytes Absolute: 2 10*3/uL (ref 0.7–3.1)
Lymphs: 28 %
MCH: 31 pg (ref 26.6–33.0)
MCHC: 33.6 g/dL (ref 31.5–35.7)
MCV: 92 fL (ref 79–97)
Monocytes Absolute: 0.5 10*3/uL (ref 0.1–0.9)
Monocytes: 8 %
Neutrophils Absolute: 4.4 10*3/uL (ref 1.4–7.0)
Neutrophils: 62 %
RBC: 4.07 x10E6/uL (ref 3.77–5.28)
RDW: 13 % (ref 11.7–15.4)
WBC: 7.1 10*3/uL (ref 3.4–10.8)

## 2023-02-13 LAB — TSH: TSH: 2.93 u[IU]/mL (ref 0.450–4.500)

## 2023-02-13 LAB — HEMOGLOBIN A1C
Est. average glucose Bld gHb Est-mCnc: 126 mg/dL
Hgb A1c MFr Bld: 6 % — ABNORMAL HIGH (ref 4.8–5.6)

## 2023-02-14 ENCOUNTER — Other Ambulatory Visit: Payer: Self-pay | Admitting: Nurse Practitioner

## 2023-02-14 DIAGNOSIS — M8589 Other specified disorders of bone density and structure, multiple sites: Secondary | ICD-10-CM

## 2023-02-18 ENCOUNTER — Ambulatory Visit (INDEPENDENT_AMBULATORY_CARE_PROVIDER_SITE_OTHER): Payer: BC Managed Care – PPO

## 2023-02-18 ENCOUNTER — Ambulatory Visit: Payer: BC Managed Care – PPO | Admitting: Family

## 2023-02-18 VITALS — BP 118/64 | HR 70 | Ht 64.0 in | Wt 153.0 lb

## 2023-02-18 DIAGNOSIS — I10 Essential (primary) hypertension: Secondary | ICD-10-CM

## 2023-02-18 DIAGNOSIS — M8589 Other specified disorders of bone density and structure, multiple sites: Secondary | ICD-10-CM | POA: Diagnosis not present

## 2023-02-18 DIAGNOSIS — E782 Mixed hyperlipidemia: Secondary | ICD-10-CM | POA: Diagnosis not present

## 2023-02-18 MED ORDER — FEXOFENADINE HCL 180 MG PO TABS: 180.0000 mg | ORAL_TABLET | Freq: Every day | ORAL | 1 refills | Status: DC

## 2023-02-18 NOTE — Progress Notes (Unsigned)
Established Patient Office Visit  Subjective:  Patient ID: Krista English, female    DOB: 03-09-1961  Age: 62 y.o. MRN: [id number]  Chief Complaint  Patient presents with   Follow-up    4 month follow up    Patient is here today for her 3 months follow up.  She has been feeling fairly well since last appointment.   She does have additional concerns to discuss today.  She has been having allergy symptoms. Says that she is still taking her zyrtec, but she isn't getting relief from it.   Labs were done last week for today's appointment so we will review today.  Her LDL had increased again, patient denies any major changes to her dietary intake.  A1C went up to 6.0, was 5.9 at last visit.   She needs refills.   I have reviewed her active problem list, medication list, allergies, notes from last encounter, lab results for her appointment today.    No other concerns at this time.   Past Medical History:  Diagnosis Date   GERD (gastroesophageal reflux disease)    High cholesterol    Hypertension     Past Surgical History:  Procedure Laterality Date   CESAREAN SECTION     COLONOSCOPY WITH PROPOFOL N/A 12/29/2020   Procedure: COLONOSCOPY WITH PROPOFOL;  Surgeon: Virgel Manifold, MD;  Location: [facility] ENDOSCOPY;  Service: Endoscopy;  Laterality: N/A;    Social History   Socioeconomic History   Marital status: Single    Spouse name: Not on file   Number of children: Not on file   Years of education: Not on file   Highest education level: Not on file  Occupational History   Not on file  Tobacco Use   Smoking status: Former    Types: Cigarettes    Quit date: 12/02/1998    Years since quitting: 24.2   Smokeless tobacco: Never  Vaping Use   Vaping Use: Never used  Substance and Sexual Activity   Alcohol use: Yes    Comment: occasional   Drug use: Never   Sexual activity: Not on file  Other Topics Concern   Not on file  Social History Narrative   Not on file    Social Determinants of Health   Financial Resource Strain: Not on file  Food Insecurity: Not on file  Transportation Needs: Not on file  Physical Activity: Not on file  Stress: Not on file  Social Connections: Not on file  Intimate Partner Violence: Not on file    Family History  Problem Relation Age of Onset   Breast cancer Neg Hx     Allergies  Allergen Reactions   Ace Inhibitors Other (See Comments), Shortness Of Breath and Cough    Other reaction(s): Cough    Review of Systems  HENT:  Positive for congestion.   Respiratory:  Positive for cough.   All other systems reviewed and are negative.      Objective:   BP 118/64   Pulse 70   Ht 5\' 4"  (1.626 m)   Wt 153 lb (69.4 kg)   SpO2 97%   BMI 26.26 kg/m   Vitals:   02/18/23 1318  BP: 118/64  Pulse: 70  Height: 5\' 4"  (1.626 m)  Weight: 153 lb (69.4 kg)  SpO2: 97%  BMI (Calculated): 26.25    Physical Exam Vitals and nursing note reviewed.  Constitutional:      Appearance: Normal appearance. She is normal weight.  HENT:  Head: Normocephalic.     Nose: Congestion and rhinorrhea present. Rhinorrhea is clear.  Eyes:     General: Allergic shiner present.     Extraocular Movements: Extraocular movements intact.     Conjunctiva/sclera: Conjunctivae normal.     Pupils: Pupils are equal, round, and reactive to light.  Cardiovascular:     Rate and Rhythm: Normal rate.  Pulmonary:     Effort: Pulmonary effort is normal.  Neurological:     Mental Status: She is alert.      No results found for any visits on 02/18/23.  Recent Results (from the past 2160 hour(s))  Hemoglobin A1c     Status: Abnormal   Collection Time: 02/12/23  8:53 AM  Result Value Ref Range   Hgb A1c MFr Bld 6.0 (H) 4.8 - 5.6 %    Comment:          Prediabetes: 5.7 - 6.4          Diabetes: >6.4          Glycemic control for adults with diabetes: <7.0    Est. average glucose Bld gHb Est-mCnc 126 mg/dL  TSH     Status: None    Collection Time: 02/12/23  8:53 AM  Result Value Ref Range   TSH 2.930 0.450 - 4.500 uIU/mL  CMP14+EGFR     Status: None   Collection Time: 02/12/23  8:53 AM  Result Value Ref Range   Glucose 90 70 - 99 mg/dL   BUN 22 8 - 27 mg/dL   Creatinine, Ser 0.96 0.57 - 1.00 mg/dL   eGFR 66 >59 mL/min/1.73   BUN/Creatinine Ratio 23 12 - 28   Sodium 139 134 - 144 mmol/L   Potassium 4.4 3.5 - 5.2 mmol/L   Chloride 101 96 - 106 mmol/L   CO2 24 20 - 29 mmol/L   Calcium 10.0 8.7 - 10.3 mg/dL   Total Protein 7.0 6.0 - 8.5 g/dL   Albumin 4.2 3.9 - 4.9 g/dL   Globulin, Total 2.8 1.5 - 4.5 g/dL   Albumin/Globulin Ratio 1.5 1.2 - 2.2   Bilirubin Total 0.3 0.0 - 1.2 mg/dL   Alkaline Phosphatase 73 44 - 121 IU/L   AST 22 0 - 40 IU/L   ALT 20 0 - 32 IU/L  Lipid panel     Status: Abnormal   Collection Time: 02/12/23  8:53 AM  Result Value Ref Range   Cholesterol, Total 192 100 - 199 mg/dL   Triglycerides 77 0 - 149 mg/dL   HDL 67 >39 mg/dL   VLDL Cholesterol Cal 14 5 - 40 mg/dL   LDL Chol Calc (NIH) 111 (H) 0 - 99 mg/dL   Chol/HDL Ratio 2.9 0.0 - 4.4 ratio    Comment:                                   T. Chol/HDL Ratio                                             Men  Women                               1/2 Avg.Risk  3.4    3.3  Avg.Risk  5.0    4.4                                2X Avg.Risk  9.6    7.1                                3X Avg.Risk 23.4   11.0   CBC With Differential     Status: None   Collection Time: 02/12/23  8:53 AM  Result Value Ref Range   WBC 7.1 3.4 - 10.8 x10E3/uL   RBC 4.07 3.77 - 5.28 x10E6/uL   Hemoglobin 12.6 11.1 - 15.9 g/dL   Hematocrit 37.5 34.0 - 46.6 %   MCV 92 79 - 97 fL   MCH 31.0 26.6 - 33.0 pg   MCHC 33.6 31.5 - 35.7 g/dL   RDW 13.0 11.7 - 15.4 %   Neutrophils 62 Not Estab. %   Lymphs 28 Not Estab. %   Monocytes 8 Not Estab. %   Eos 2 Not Estab. %   Basos 0 Not Estab. %   Neutrophils Absolute 4.4 1.4 - 7.0 x10E3/uL    Lymphocytes Absolute 2.0 0.7 - 3.1 x10E3/uL   Monocytes Absolute 0.5 0.1 - 0.9 x10E3/uL   EOS (ABSOLUTE) 0.1 0.0 - 0.4 x10E3/uL   Basophils Absolute 0.0 0.0 - 0.2 x10E3/uL   Immature Granulocytes 0 Not Estab. %   Immature Grans (Abs) 0.0 0.0 - 0.1 x10E3/uL      Assessment & Plan:   Problem List Items Addressed This Visit     Benign essential hypertension (Chronic)   HLD (hyperlipidemia) - Primary    Return in about 4 months (around 06/20/2023) for F/U with Chelsa..   Total time spent: 20 minutes  Mechele Claude, FNP  02/18/2023

## 2023-02-19 ENCOUNTER — Encounter: Payer: Self-pay | Admitting: Family

## 2023-03-09 ENCOUNTER — Other Ambulatory Visit: Payer: Self-pay | Admitting: Nurse Practitioner

## 2023-06-17 ENCOUNTER — Other Ambulatory Visit: Payer: BC Managed Care – PPO

## 2023-06-17 DIAGNOSIS — I10 Essential (primary) hypertension: Secondary | ICD-10-CM

## 2023-06-17 DIAGNOSIS — E782 Mixed hyperlipidemia: Secondary | ICD-10-CM

## 2023-06-17 DIAGNOSIS — E669 Obesity, unspecified: Secondary | ICD-10-CM

## 2023-06-18 LAB — CBC WITH DIFFERENTIAL
Basophils Absolute: 0 10*3/uL (ref 0.0–0.2)
Basos: 0 %
EOS (ABSOLUTE): 0.2 10*3/uL (ref 0.0–0.4)
Eos: 2 %
Hematocrit: 37.8 % (ref 34.0–46.6)
Hemoglobin: 12.6 g/dL (ref 11.1–15.9)
Immature Grans (Abs): 0 10*3/uL (ref 0.0–0.1)
Immature Granulocytes: 0 %
Lymphocytes Absolute: 2.2 10*3/uL (ref 0.7–3.1)
Lymphs: 26 %
MCH: 31 pg (ref 26.6–33.0)
MCHC: 33.3 g/dL (ref 31.5–35.7)
MCV: 93 fL (ref 79–97)
Monocytes Absolute: 0.5 10*3/uL (ref 0.1–0.9)
Monocytes: 6 %
Neutrophils Absolute: 5.6 10*3/uL (ref 1.4–7.0)
Neutrophils: 66 %
RBC: 4.06 x10E6/uL (ref 3.77–5.28)
RDW: 13 % (ref 11.7–15.4)
WBC: 8.4 10*3/uL (ref 3.4–10.8)

## 2023-06-18 LAB — CMP14+EGFR
ALT: 22 IU/L (ref 0–32)
AST: 22 IU/L (ref 0–40)
Albumin: 4 g/dL (ref 3.9–4.9)
Alkaline Phosphatase: 90 IU/L (ref 44–121)
BUN/Creatinine Ratio: 17 (ref 12–28)
BUN: 18 mg/dL (ref 8–27)
Bilirubin Total: 0.3 mg/dL (ref 0.0–1.2)
CO2: 23 mmol/L (ref 20–29)
Calcium: 10.1 mg/dL (ref 8.7–10.3)
Chloride: 101 mmol/L (ref 96–106)
Creatinine, Ser: 1.08 mg/dL — ABNORMAL HIGH (ref 0.57–1.00)
Globulin, Total: 2.8 g/dL (ref 1.5–4.5)
Glucose: 89 mg/dL (ref 70–99)
Potassium: 4.9 mmol/L (ref 3.5–5.2)
Sodium: 140 mmol/L (ref 134–144)
Total Protein: 6.8 g/dL (ref 6.0–8.5)
eGFR: 57 mL/min/{1.73_m2} — ABNORMAL LOW (ref >59)

## 2023-06-18 LAB — LIPID PANEL
Chol/HDL Ratio: 2.6 ratio (ref 0.0–4.4)
Cholesterol, Total: 172 mg/dL (ref 100–199)
HDL: 65 mg/dL (ref >39)
LDL Chol Calc (NIH): 93 mg/dL (ref 0–99)
Triglycerides: 72 mg/dL (ref 0–149)
VLDL Cholesterol Cal: 14 mg/dL (ref 5–40)

## 2023-06-18 LAB — HEMOGLOBIN A1C
Est. average glucose Bld gHb Est-mCnc: 126 mg/dL
Hgb A1c MFr Bld: 6 % — ABNORMAL HIGH (ref 4.8–5.6)

## 2023-06-18 LAB — TSH: TSH: 2.25 u[IU]/mL (ref 0.450–4.500)

## 2023-06-20 ENCOUNTER — Encounter: Payer: Self-pay | Admitting: Cardiology

## 2023-06-20 ENCOUNTER — Ambulatory Visit: Payer: BC Managed Care – PPO | Admitting: Cardiology

## 2023-06-20 VITALS — BP 110/70 | HR 65 | Ht 64.0 in | Wt 146.4 lb

## 2023-06-20 DIAGNOSIS — R7303 Prediabetes: Secondary | ICD-10-CM | POA: Diagnosis not present

## 2023-06-20 DIAGNOSIS — E782 Mixed hyperlipidemia: Secondary | ICD-10-CM | POA: Diagnosis not present

## 2023-06-20 DIAGNOSIS — I10 Essential (primary) hypertension: Secondary | ICD-10-CM | POA: Diagnosis not present

## 2023-06-20 NOTE — Progress Notes (Signed)
Established Patient Office Visit  Subjective:  Patient ID: Krista English, female    DOB: [birth year]  Age: 62 y.o. MRN: [id number]  Chief Complaint  Patient presents with   Follow-up    4 month follow up    Patient in office for 4 month follow up, discuss recent lab work. Feeling well. No complaints today.  Recent fasting blood work. Decreased kidney function, increase water intake. Hgb A1c stable. Continue to work on diet and exercise. Mammogram up to date. Colonoscopy up to date.    No other concerns at this time.   Past Medical History:  Diagnosis Date   GERD (gastroesophageal reflux disease)    High cholesterol    Hypertension     Past Surgical History:  Procedure Laterality Date   CESAREAN SECTION     COLONOSCOPY WITH PROPOFOL N/A 12/29/2020   Procedure: COLONOSCOPY WITH PROPOFOL;  Surgeon: Pasty Spillers, MD;  Location: [facility] ENDOSCOPY;  Service: Endoscopy;  Laterality: N/A;    Social History   Socioeconomic History   Marital status: Single    Spouse name: Not on file   Number of children: Not on file   Years of education: Not on file   Highest education level: Not on file  Occupational History   Not on file  Tobacco Use   Smoking status: Former    Current packs/day: 0.00    Types: Cigarettes    Quit date: 12/02/1998    Years since quitting: 24.5   Smokeless tobacco: Never  Vaping Use   Vaping status: Never Used  Substance and Sexual Activity   Alcohol use: Yes    Comment: occasional   Drug use: Never   Sexual activity: Not on file  Other Topics Concern   Not on file  Social History Narrative   Not on file   Social Determinants of Health   Financial Resource Strain: Not on file  Food Insecurity: Not on file  Transportation Needs: Not on file  Physical Activity: Not on file  Stress: Not on file  Social Connections: Not on file  Intimate Partner Violence: Not on file    Family History  Problem Relation Age of Onset   Breast cancer Neg  Hx     Allergies  Allergen Reactions   Ace Inhibitors Other (See Comments), Shortness Of Breath and Cough    Other reaction(s): Cough    Review of Systems  Constitutional: Negative.   HENT: Negative.    Eyes: Negative.   Respiratory: Negative.  Negative for shortness of breath.   Cardiovascular: Negative.  Negative for chest pain.  Gastrointestinal: Negative.  Negative for abdominal pain, constipation and diarrhea.  Genitourinary: Negative.   Musculoskeletal:  Negative for joint pain and myalgias.  Skin: Negative.   Neurological: Negative.  Negative for dizziness and headaches.  Endo/Heme/Allergies: Negative.   All other systems reviewed and are negative.      Objective:   BP 110/70   Pulse 65   Ht 5\' 4"  (1.626 m)   Wt 146 lb 6.4 oz (66.4 kg)   SpO2 96%   BMI 25.13 kg/m   Vitals:   06/20/23 1456  BP: 110/70  Pulse: 65  Height: 5\' 4"  (1.626 m)  Weight: 146 lb 6.4 oz (66.4 kg)  SpO2: 96%  BMI (Calculated): 25.12    Physical Exam Vitals and nursing note reviewed.  Constitutional:      Appearance: Normal appearance. She is normal weight.  HENT:     Head: Normocephalic and atraumatic.  Nose: Nose normal.     Mouth/Throat:     Mouth: Mucous membranes are moist.  Eyes:     Extraocular Movements: Extraocular movements intact.     Conjunctiva/sclera: Conjunctivae normal.     Pupils: Pupils are equal, round, and reactive to light.  Cardiovascular:     Rate and Rhythm: Normal rate and regular rhythm.     Pulses: Normal pulses.     Heart sounds: Normal heart sounds.  Pulmonary:     Effort: Pulmonary effort is normal.     Breath sounds: Normal breath sounds.  Abdominal:     General: Abdomen is flat. Bowel sounds are normal.     Palpations: Abdomen is soft.  Musculoskeletal:        General: Normal range of motion.     Cervical back: Normal range of motion.  Skin:    General: Skin is warm and dry.  Neurological:     General: No focal deficit present.      Mental Status: She is alert and oriented to person, place, and time.  Psychiatric:        Mood and Affect: Mood normal.        Behavior: Behavior normal.        Thought Content: Thought content normal.        Judgment: Judgment normal.      No results found for any visits on 06/20/23.  Recent Results (from the past 2160 hour(s))  Hemoglobin A1c     Status: Abnormal   Collection Time: 06/17/23  8:33 AM  Result Value Ref Range   Hgb A1c MFr Bld 6.0 (H) 4.8 - 5.6 %    Comment:          Prediabetes: 5.7 - 6.4          Diabetes: >6.4          Glycemic control for adults with diabetes: <7.0    Est. average glucose Bld gHb Est-mCnc 126 mg/dL  TSH     Status: None   Collection Time: 06/17/23  8:33 AM  Result Value Ref Range   TSH 2.250 0.450 - 4.500 uIU/mL  CMP14+EGFR     Status: Abnormal   Collection Time: 06/17/23  8:33 AM  Result Value Ref Range   Glucose 89 70 - 99 mg/dL   BUN 18 8 - 27 mg/dL   Creatinine, Ser 8.29 (H) 0.57 - 1.00 mg/dL   eGFR 57 (L) >56 OZ/HYQ/6.57   BUN/Creatinine Ratio 17 12 - 28   Sodium 140 134 - 144 mmol/L   Potassium 4.9 3.5 - 5.2 mmol/L   Chloride 101 96 - 106 mmol/L   CO2 23 20 - 29 mmol/L   Calcium 10.1 8.7 - 10.3 mg/dL   Total Protein 6.8 6.0 - 8.5 g/dL   Albumin 4.0 3.9 - 4.9 g/dL   Globulin, Total 2.8 1.5 - 4.5 g/dL   Bilirubin Total 0.3 0.0 - 1.2 mg/dL   Alkaline Phosphatase 90 44 - 121 IU/L   AST 22 0 - 40 IU/L   ALT 22 0 - 32 IU/L  Lipid panel     Status: None   Collection Time: 06/17/23  8:33 AM  Result Value Ref Range   Cholesterol, Total 172 100 - 199 mg/dL   Triglycerides 72 0 - 149 mg/dL   HDL 65 >84 mg/dL   VLDL Cholesterol Cal 14 5 - 40 mg/dL   LDL Chol Calc (NIH) 93 0 - 99 mg/dL   Chol/HDL Ratio  2.6 0.0 - 4.4 ratio    Comment:                                   T. Chol/HDL Ratio                                             Men  Women                               1/2 Avg.Risk  3.4    3.3                                    Avg.Risk  5.0    4.4                                2X Avg.Risk  9.6    7.1                                3X Avg.Risk 23.4   11.0   CBC With Differential     Status: None   Collection Time: 06/17/23  8:33 AM  Result Value Ref Range   WBC 8.4 3.4 - 10.8 x10E3/uL   RBC 4.06 3.77 - 5.28 x10E6/uL   Hemoglobin 12.6 11.1 - 15.9 g/dL   Hematocrit 78.2 95.6 - 46.6 %   MCV 93 79 - 97 fL   MCH 31.0 26.6 - 33.0 pg   MCHC 33.3 31.5 - 35.7 g/dL   RDW 21.3 08.6 - 57.8 %   Neutrophils 66 Not Estab. %   Lymphs 26 Not Estab. %   Monocytes 6 Not Estab. %   Eos 2 Not Estab. %   Basos 0 Not Estab. %   Neutrophils Absolute 5.6 1.4 - 7.0 x10E3/uL   Lymphocytes Absolute 2.2 0.7 - 3.1 x10E3/uL   Monocytes Absolute 0.5 0.1 - 0.9 x10E3/uL   EOS (ABSOLUTE) 0.2 0.0 - 0.4 x10E3/uL   Basophils Absolute 0.0 0.0 - 0.2 x10E3/uL   Immature Granulocytes 0 Not Estab. %   Immature Grans (Abs) 0.0 0.0 - 0.1 x10E3/uL    Comment: **Effective June 30, 2023, profile [id number] CBC/Differential**   (No Platelet) will be made non-orderable. [facility] Offers:   [id number] CBC With Differential/Platelet       Assessment & Plan:  Continue same medications. Increase water intake.  Continue to work on diet and exercise.  Mammogram up to date. Colonoscopy up to date.   Problem List Items Addressed This Visit       Cardiovascular and Mediastinum   Benign essential hypertension (Chronic)   Relevant Orders   TSH   CMP14+EGFR   CBC With Diff/Platelet     Other   HLD (hyperlipidemia) - Primary   Relevant Orders   TSH   Lipid panel   Other Visit Diagnoses     Pre-diabetes       Relevant Orders   Hemoglobin A1c   TSH       Return in about 4 months (around 10/21/2023).   Total time spent: 25 minutes  Marisue Ivan, NP  06/20/2023   This document may have been prepared by [facility] Voice Recognition software and as such may include unintentional dictation errors.

## 2023-08-14 ENCOUNTER — Other Ambulatory Visit: Payer: Self-pay

## 2023-08-14 MED ORDER — CELECOXIB 100 MG PO CAPS
100.0000 mg | ORAL_CAPSULE | Freq: Every day | ORAL | 5 refills | Status: DC
Start: 2023-08-14 — End: 2024-02-09

## 2023-10-02 ENCOUNTER — Other Ambulatory Visit: Payer: Self-pay

## 2023-10-02 MED ORDER — ROSUVASTATIN CALCIUM 40 MG PO TABS: 40.0000 mg | ORAL_TABLET | Freq: Every day | ORAL | 5 refills | Status: DC

## 2023-10-16 ENCOUNTER — Other Ambulatory Visit: Payer: Self-pay

## 2023-10-16 MED ORDER — HYDROCHLOROTHIAZIDE 25 MG PO TABS: 25.0000 mg | ORAL_TABLET | Freq: Every day | ORAL | 3 refills | Status: DC

## 2023-10-16 MED ORDER — OLMESARTAN MEDOXOMIL 20 MG PO TABS: 20.0000 mg | ORAL_TABLET | Freq: Every day | ORAL | 5 refills | Status: DC

## 2023-10-17 ENCOUNTER — Other Ambulatory Visit: Payer: Self-pay | Admitting: Nurse Practitioner

## 2023-10-17 DIAGNOSIS — Z1231 Encounter for screening mammogram for malignant neoplasm of breast: Secondary | ICD-10-CM

## 2023-10-21 ENCOUNTER — Ambulatory Visit: Payer: BC Managed Care – PPO | Admitting: Cardiology

## 2023-10-29 ENCOUNTER — Inpatient Hospital Stay
Admission: RE | Admit: 2023-10-29 | Discharge: 2023-10-29 | Disposition: A | Discharge status: Home / Self Care | Payer: BC Managed Care – PPO | Source: Ambulatory Visit

## 2023-10-29 DIAGNOSIS — Z1231 Encounter for screening mammogram for malignant neoplasm of breast: Secondary | ICD-10-CM

## 2023-11-21 ENCOUNTER — Encounter: Payer: Self-pay | Admitting: Physician Assistant

## 2023-12-17 ENCOUNTER — Other Ambulatory Visit: Payer: BC Managed Care – PPO

## 2023-12-17 DIAGNOSIS — E782 Mixed hyperlipidemia: Secondary | ICD-10-CM

## 2023-12-17 DIAGNOSIS — I10 Essential (primary) hypertension: Secondary | ICD-10-CM

## 2023-12-17 DIAGNOSIS — R7303 Prediabetes: Secondary | ICD-10-CM

## 2023-12-18 LAB — CBC WITH DIFF/PLATELET
Basophils Absolute: 0 10*3/uL (ref 0.0–0.2)
Basos: 0 %
EOS (ABSOLUTE): 0.1 10*3/uL (ref 0.0–0.4)
Eos: 1 %
Hematocrit: 38.4 % (ref 34.0–46.6)
Hemoglobin: 12.6 g/dL (ref 11.1–15.9)
Immature Grans (Abs): 0 10*3/uL (ref 0.0–0.1)
Immature Granulocytes: 0 %
Lymphocytes Absolute: 2.3 10*3/uL (ref 0.7–3.1)
Lymphs: 29 %
MCH: 31.4 pg (ref 26.6–33.0)
MCHC: 32.8 g/dL (ref 31.5–35.7)
MCV: 96 fL (ref 79–97)
Monocytes Absolute: 0.5 10*3/uL (ref 0.1–0.9)
Monocytes: 6 %
Neutrophils Absolute: 5 10*3/uL (ref 1.4–7.0)
Neutrophils: 64 %
Platelets: 189 10*3/uL (ref 150–450)
RBC: 4.01 x10E6/uL (ref 3.77–5.28)
RDW: 13.1 % (ref 11.7–15.4)
WBC: 7.9 10*3/uL (ref 3.4–10.8)

## 2023-12-18 LAB — CMP14+EGFR
ALT: 19 [IU]/L (ref 0–32)
AST: 22 [IU]/L (ref 0–40)
Albumin: 4 g/dL (ref 3.9–4.9)
Alkaline Phosphatase: 57 [IU]/L (ref 44–121)
BUN/Creatinine Ratio: 19 (ref 12–28)
BUN: 18 mg/dL (ref 8–27)
Bilirubin Total: 0.4 mg/dL (ref 0.0–1.2)
CO2: 23 mmol/L (ref 20–29)
Calcium: 9.5 mg/dL (ref 8.7–10.3)
Chloride: 99 mmol/L (ref 96–106)
Creatinine, Ser: 0.96 mg/dL (ref 0.57–1.00)
Globulin, Total: 2.3 g/dL (ref 1.5–4.5)
Glucose: 84 mg/dL (ref 70–99)
Potassium: 3.9 mmol/L (ref 3.5–5.2)
Sodium: 135 mmol/L (ref 134–144)
Total Protein: 6.3 g/dL (ref 6.0–8.5)
eGFR: 66 mL/min/{1.73_m2} (ref >59)

## 2023-12-18 LAB — TSH: TSH: 2.46 u[IU]/mL (ref 0.450–4.500)

## 2023-12-18 LAB — HEMOGLOBIN A1C
Est. average glucose Bld gHb Est-mCnc: 123 mg/dL
Hgb A1c MFr Bld: 5.9 % — ABNORMAL HIGH (ref 4.8–5.6)

## 2023-12-18 LAB — LIPID PANEL
Chol/HDL Ratio: 2.8 {ratio} (ref 0.0–4.4)
Cholesterol, Total: 186 mg/dL (ref 100–199)
HDL: 66 mg/dL (ref >39)
LDL Chol Calc (NIH): 105 mg/dL — ABNORMAL HIGH (ref 0–99)
Triglycerides: 82 mg/dL (ref 0–149)
VLDL Cholesterol Cal: 15 mg/dL (ref 5–40)

## 2023-12-22 ENCOUNTER — Ambulatory Visit: Payer: BC Managed Care – PPO | Admitting: Cardiology

## 2023-12-22 ENCOUNTER — Encounter: Payer: Self-pay | Admitting: Cardiology

## 2023-12-22 VITALS — BP 144/64 | HR 81 | Ht 64.0 in | Wt 142.0 lb

## 2023-12-22 DIAGNOSIS — E782 Mixed hyperlipidemia: Secondary | ICD-10-CM

## 2023-12-22 DIAGNOSIS — I10 Essential (primary) hypertension: Secondary | ICD-10-CM

## 2023-12-22 NOTE — Progress Notes (Signed)
Established Patient Office Visit  Subjective:  Patient ID: Krista English, female    DOB: [birth year]  Age: 63 y.o. MRN: [id number]  Chief Complaint  Patient presents with   Follow-up    4 Months Follow Up    Patient in office for 4 month follow up, discuss recent lab work. Patient reports feeling well overall, no new complaints today. Discussed recent lab work. Hgb A1c stable. LDL slightly elevated. Continue same medications.  Due for pap smear. Last one 11/2020 negative.  Patient reports she has started smoking again. Wants to attempt to quit on her own, she has before.  Up to date on colonoscopy.    No other concerns at this time.   Past Medical History:  Diagnosis Date   GERD (gastroesophageal reflux disease)    High cholesterol    Hypertension     Past Surgical History:  Procedure Laterality Date   CESAREAN SECTION     COLONOSCOPY WITH PROPOFOL N/A 12/29/2020   Procedure: COLONOSCOPY WITH PROPOFOL;  Surgeon: Pasty Spillers, MD;  Location: [facility] ENDOSCOPY;  Service: Endoscopy;  Laterality: N/A;    Social History   Socioeconomic History   Marital status: Single    Spouse name: Not on file   Number of children: Not on file   Years of education: Not on file   Highest education level: Not on file  Occupational History   Not on file  Tobacco Use   Smoking status: Every Day    Current packs/day: 0.00    Types: Cigarettes    Last attempt to quit: 12/02/1998    Years since quitting: 25.0   Smokeless tobacco: Never  Vaping Use   Vaping status: Never Used  Substance and Sexual Activity   Alcohol use: Yes    Comment: occasional   Drug use: Never   Sexual activity: Not on file  Other Topics Concern   Not on file  Social History Narrative   Not on file   Social Drivers of Health   Financial Resource Strain: Not on file  Food Insecurity: Not on file  Transportation Needs: Not on file  Physical Activity: Not on file  Stress: Not on file  Social Connections:  Not on file  Intimate Partner Violence: Not on file    Family History  Problem Relation Age of Onset   Breast cancer Neg Hx     Allergies  Allergen Reactions   Ace Inhibitors Other (See Comments), Shortness Of Breath and Cough    Other reaction(s): Cough    Outpatient Medications Prior to Visit  Medication Sig   celecoxib (CELEBREX) 100 MG capsule Take 1 capsule (100 mg total) by mouth daily.   cetirizine (ZYRTEC) 10 MG tablet Take 10 mg by mouth daily.   hydrochlorothiazide (HYDRODIURIL) 25 MG tablet Take 1 tablet (25 mg total) by mouth daily.   olmesartan (BENICAR) 20 MG tablet Take 1 tablet (20 mg total) by mouth daily.   rosuvastatin (CRESTOR) 40 MG tablet Take 1 tablet (40 mg total) by mouth daily.   No facility-administered medications prior to visit.    Review of Systems  Constitutional: Negative.   HENT: Negative.    Eyes: Negative.   Respiratory: Negative.  Negative for shortness of breath.   Cardiovascular: Negative.  Negative for chest pain.  Gastrointestinal: Negative.  Negative for abdominal pain, constipation and diarrhea.  Genitourinary: Negative.   Musculoskeletal:  Negative for joint pain and myalgias.  Skin: Negative.   Neurological: Negative.  Negative for dizziness  and headaches.  Endo/Heme/Allergies: Negative.   All other systems reviewed and are negative.      Objective:   BP (!) 144/64   Pulse 81   Ht 5\' 4"  (1.626 m)   Wt 142 lb (64.4 kg)   SpO2 97%   BMI 24.37 kg/m   Vitals:   12/22/23 0905  BP: (!) 144/64  Pulse: 81  Height: 5\' 4"  (1.626 m)  Weight: 142 lb (64.4 kg)  SpO2: 97%  BMI (Calculated): 24.36    Physical Exam Vitals and nursing note reviewed.  Constitutional:      Appearance: Normal appearance. She is normal weight.  HENT:     Head: Normocephalic and atraumatic.     Nose: Nose normal.     Mouth/Throat:     Mouth: Mucous membranes are moist.  Eyes:     Extraocular Movements: Extraocular movements intact.      Conjunctiva/sclera: Conjunctivae normal.     Pupils: Pupils are equal, round, and reactive to light.  Cardiovascular:     Rate and Rhythm: Normal rate and regular rhythm.     Pulses: Normal pulses.     Heart sounds: Normal heart sounds.  Pulmonary:     Effort: Pulmonary effort is normal.     Breath sounds: Normal breath sounds.  Abdominal:     General: Abdomen is flat. Bowel sounds are normal.     Palpations: Abdomen is soft.  Musculoskeletal:        General: Normal range of motion.     Cervical back: Normal range of motion.  Skin:    General: Skin is warm and dry.  Neurological:     General: No focal deficit present.     Mental Status: She is alert and oriented to person, place, and time.  Psychiatric:        Mood and Affect: Mood normal.        Behavior: Behavior normal.        Thought Content: Thought content normal.        Judgment: Judgment normal.      No results found for any visits on 12/22/23.  Recent Results (from the past 2160 hours)  CBC With Diff/Platelet     Status: None   Collection Time: 12/17/23  8:20 AM  Result Value Ref Range   WBC 7.9 3.4 - 10.8 x10E3/uL   RBC 4.01 3.77 - 5.28 x10E6/uL   Hemoglobin 12.6 11.1 - 15.9 g/dL   Hematocrit 47.8 29.5 - 46.6 %   MCV 96 79 - 97 fL   MCH 31.4 26.6 - 33.0 pg   MCHC 32.8 31.5 - 35.7 g/dL   RDW 62.1 30.8 - 65.7 %   Platelets 189 150 - 450 x10E3/uL   Neutrophils 64 Not Estab. %   Lymphs 29 Not Estab. %   Monocytes 6 Not Estab. %   Eos 1 Not Estab. %   Basos 0 Not Estab. %   Neutrophils Absolute 5.0 1.4 - 7.0 x10E3/uL   Lymphocytes Absolute 2.3 0.7 - 3.1 x10E3/uL   Monocytes Absolute 0.5 0.1 - 0.9 x10E3/uL   EOS (ABSOLUTE) 0.1 0.0 - 0.4 x10E3/uL   Basophils Absolute 0.0 0.0 - 0.2 x10E3/uL   Immature Granulocytes 0 Not Estab. %   Immature Grans (Abs) 0.0 0.0 - 0.1 x10E3/uL  Lipid panel     Status: Abnormal   Collection Time: 12/17/23  8:20 AM  Result Value Ref Range   Cholesterol, Total 186 100 - 199  mg/dL  Triglycerides 82 0 - 149 mg/dL   HDL 66 >81 mg/dL   VLDL Cholesterol Cal 15 5 - 40 mg/dL   LDL Chol Calc (NIH) 191 (H) 0 - 99 mg/dL   Chol/HDL Ratio 2.8 0.0 - 4.4 ratio    Comment:                                   T. Chol/HDL Ratio                                             Men  Women                               1/2 Avg.Risk  3.4    3.3                                   Avg.Risk  5.0    4.4                                2X Avg.Risk  9.6    7.1                                3X Avg.Risk 23.4   11.0   CMP14+EGFR     Status: None   Collection Time: 12/17/23  8:20 AM  Result Value Ref Range   Glucose 84 70 - 99 mg/dL   BUN 18 8 - 27 mg/dL   Creatinine, Ser 4.78 0.57 - 1.00 mg/dL   eGFR 66 >29 FA/OZH/0.86   BUN/Creatinine Ratio 19 12 - 28   Sodium 135 134 - 144 mmol/L   Potassium 3.9 3.5 - 5.2 mmol/L   Chloride 99 96 - 106 mmol/L   CO2 23 20 - 29 mmol/L   Calcium 9.5 8.7 - 10.3 mg/dL   Total Protein 6.3 6.0 - 8.5 g/dL   Albumin 4.0 3.9 - 4.9 g/dL   Globulin, Total 2.3 1.5 - 4.5 g/dL   Bilirubin Total 0.4 0.0 - 1.2 mg/dL   Alkaline Phosphatase 57 44 - 121 IU/L   AST 22 0 - 40 IU/L   ALT 19 0 - 32 IU/L  TSH     Status: None   Collection Time: 12/17/23  8:20 AM  Result Value Ref Range   TSH 2.460 0.450 - 4.500 uIU/mL  Hemoglobin A1c     Status: Abnormal   Collection Time: 12/17/23  8:20 AM  Result Value Ref Range   Hgb A1c MFr Bld 5.9 (H) 4.8 - 5.6 %    Comment:          Prediabetes: 5.7 - 6.4          Diabetes: >6.4          Glycemic control for adults with diabetes: <7.0    Est. average glucose Bld gHb Est-mCnc 123 mg/dL      Assessment & Plan:  Continue same medications. Pap smear at next visit.   Problem List Items Addressed This Visit       Cardiovascular and Mediastinum  Benign essential hypertension - Primary (Chronic)     Other   HLD (hyperlipidemia)    Return in about 4 months (around 04/20/2024) for with pap smear, fasting labs prior.    Total time spent: 25 minutes  [employer], NP  12/22/2023   This document may have been prepared by Dragon Voice Recognition software and as such may include unintentional dictation errors.

## 2024-02-06 ENCOUNTER — Other Ambulatory Visit: Payer: Self-pay | Admitting: Family

## 2024-03-11 ENCOUNTER — Other Ambulatory Visit: Payer: Self-pay | Admitting: Cardiology

## 2024-03-15 ENCOUNTER — Other Ambulatory Visit: Payer: Self-pay | Admitting: Cardiology

## 2024-04-27 ENCOUNTER — Ambulatory Visit: Payer: BC Managed Care – PPO | Admitting: Internal Medicine

## 2024-05-13 ENCOUNTER — Other Ambulatory Visit

## 2024-05-13 DIAGNOSIS — I10 Essential (primary) hypertension: Secondary | ICD-10-CM

## 2024-05-13 DIAGNOSIS — R7303 Prediabetes: Secondary | ICD-10-CM

## 2024-05-13 DIAGNOSIS — E782 Mixed hyperlipidemia: Secondary | ICD-10-CM

## 2024-05-14 ENCOUNTER — Ambulatory Visit: Payer: Self-pay | Admitting: Cardiology

## 2024-05-14 LAB — LIPID PANEL
Chol/HDL Ratio: 2.4 ratio (ref 0.0–4.4)
Cholesterol, Total: 190 mg/dL (ref 100–199)
HDL: 78 mg/dL (ref >39)
LDL Chol Calc (NIH): 100 mg/dL — ABNORMAL HIGH (ref 0–99)
Triglycerides: 67 mg/dL (ref 0–149)
VLDL Cholesterol Cal: 12 mg/dL (ref 5–40)

## 2024-05-14 LAB — CMP14+EGFR
ALT: 29 IU/L (ref 0–32)
AST: 31 IU/L (ref 0–40)
Albumin: 4.1 g/dL (ref 3.9–4.9)
Alkaline Phosphatase: 70 IU/L (ref 44–121)
BUN/Creatinine Ratio: 16 (ref 12–28)
BUN: 15 mg/dL (ref 8–27)
Bilirubin Total: 0.4 mg/dL (ref 0.0–1.2)
CO2: 22 mmol/L (ref 20–29)
Calcium: 10 mg/dL (ref 8.7–10.3)
Chloride: 101 mmol/L (ref 96–106)
Creatinine, Ser: 0.96 mg/dL (ref 0.57–1.00)
Globulin, Total: 2.9 g/dL (ref 1.5–4.5)
Glucose: 86 mg/dL (ref 70–99)
Potassium: 4.2 mmol/L (ref 3.5–5.2)
Sodium: 138 mmol/L (ref 134–144)
Total Protein: 7 g/dL (ref 6.0–8.5)
eGFR: 66 mL/min/{1.73_m2} (ref >59)

## 2024-05-14 LAB — TSH: TSH: 2.34 u[IU]/mL (ref 0.450–4.500)

## 2024-05-14 LAB — HEMOGLOBIN A1C
Est. average glucose Bld gHb Est-mCnc: 123 mg/dL
Hgb A1c MFr Bld: 5.9 % — ABNORMAL HIGH (ref 4.8–5.6)

## 2024-05-18 ENCOUNTER — Encounter: Payer: Self-pay | Admitting: Internal Medicine

## 2024-05-18 ENCOUNTER — Ambulatory Visit: Admitting: Internal Medicine

## 2024-05-18 VITALS — BP 122/82 | HR 65 | Ht 64.0 in | Wt 143.2 lb

## 2024-05-18 DIAGNOSIS — E782 Mixed hyperlipidemia: Secondary | ICD-10-CM | POA: Diagnosis not present

## 2024-05-18 DIAGNOSIS — I10 Essential (primary) hypertension: Secondary | ICD-10-CM

## 2024-05-18 DIAGNOSIS — R7303 Prediabetes: Secondary | ICD-10-CM | POA: Diagnosis not present

## 2024-05-18 DIAGNOSIS — Z0001 Encounter for general adult medical examination with abnormal findings: Secondary | ICD-10-CM

## 2024-05-18 DIAGNOSIS — Z0000 Encounter for general adult medical examination without abnormal findings: Secondary | ICD-10-CM

## 2024-05-18 DIAGNOSIS — Z124 Encounter for screening for malignant neoplasm of cervix: Secondary | ICD-10-CM

## 2024-05-18 DIAGNOSIS — Z1272 Encounter for screening for malignant neoplasm of vagina: Secondary | ICD-10-CM

## 2024-05-18 DIAGNOSIS — Z1151 Encounter for screening for human papillomavirus (HPV): Secondary | ICD-10-CM

## 2024-05-18 NOTE — Progress Notes (Signed)
 Established Patient Office Visit  Subjective:  Patient ID: Krista English, female    DOB: 28-Aug-1961  Age: 63 y.o. MRN: [id number]  Chief Complaint  Patient presents with   Follow-up    PAP, 4 month follow up, discuss lab results    Patient comes in for her Pap smear today.  She gets her mammogram through work which is due in November of this year.  She generally feels well but continues to have lower back pain with a cough. She is up-to-date on her DEXA and colonoscopy.  Labs were done recently.    No other concerns at this time.   Past Medical History:  Diagnosis Date   GERD (gastroesophageal reflux disease)    High cholesterol    Hypertension     Past Surgical History:  Procedure Laterality Date   CESAREAN SECTION     COLONOSCOPY WITH PROPOFOL  N/A 12/29/2020   Procedure: COLONOSCOPY WITH PROPOFOL ;  Surgeon: Irby Mannan, MD;  Location: [facility] ENDOSCOPY;  Service: Endoscopy;  Laterality: N/A;    Social History   Socioeconomic History   Marital status: Single    Spouse name: Not on file   Number of children: Not on file   Years of education: Not on file   Highest education level: Not on file  Occupational History   Not on file  Tobacco Use   Smoking status: Every Day    Current packs/day: 0.00    Types: Cigarettes    Last attempt to quit: 12/02/1998    Years since quitting: 25.4   Smokeless tobacco: Never  Vaping Use   Vaping status: Never Used  Substance and Sexual Activity   Alcohol use: Yes    Comment: occasional   Drug use: Never   Sexual activity: Not on file  Other Topics Concern   Not on file  Social History Narrative   Not on file   Social Drivers of Health   Financial Resource Strain: Not on file  Food Insecurity: Not on file  Transportation Needs: Not on file  Physical Activity: Not on file  Stress: Not on file  Social Connections: Not on file  Intimate Partner Violence: Not on file    Family History  Problem Relation Age of Onset    Breast cancer Neg Hx     Allergies  Allergen Reactions   Ace Inhibitors Other (See Comments), Shortness Of Breath and Cough    Other reaction(s): Cough    Outpatient Medications Prior to Visit  Medication Sig   celecoxib  (CELEBREX ) 100 MG capsule TAKE 1 CAPSULE BY MOUTH EVERY DAY   cetirizine (ZYRTEC) 10 MG tablet Take 10 mg by mouth daily.   hydrochlorothiazide  (HYDRODIURIL ) 25 MG tablet Take 1 tablet (25 mg total) by mouth daily.   olmesartan  (BENICAR ) 20 MG tablet TAKE 1 TABLET BY MOUTH EVERY DAY   rosuvastatin  (CRESTOR ) 40 MG tablet TAKE 1 TABLET BY MOUTH EVERY DAY   No facility-administered medications prior to visit.    Review of Systems  Constitutional: Negative.  Negative for chills, diaphoresis, fever, malaise/fatigue and weight loss.  HENT: Negative.  Negative for sinus pain and sore throat.   Eyes: Negative.   Respiratory: Negative.  Negative for cough and shortness of breath.   Cardiovascular: Negative.  Negative for chest pain, palpitations and leg swelling.  Gastrointestinal: Negative.  Negative for abdominal pain, constipation, diarrhea, heartburn, nausea and vomiting.  Genitourinary: Negative.  Negative for dysuria and flank pain.  Musculoskeletal: Negative.  Negative for joint pain  and myalgias.  Skin: Negative.   Neurological: Negative.  Negative for dizziness, tingling, tremors, sensory change, speech change and headaches.  Endo/Heme/Allergies: Negative.   Psychiatric/Behavioral: Negative.  Negative for depression and suicidal ideas. The patient is not nervous/anxious.        Objective:   BP 122/82   Pulse 65   Ht 5' 4 (1.626 m)   Wt 143 lb 3.2 oz (65 kg)   SpO2 98%   BMI 24.58 kg/m   Vitals:   05/18/24 0915  BP: 122/82  Pulse: 65  Height: 5' 4 (1.626 m)  Weight: 143 lb 3.2 oz (65 kg)  SpO2: 98%  BMI (Calculated): 24.57    Physical Exam Vitals and nursing note reviewed. Exam conducted with a chaperone present.  Constitutional:       Appearance: Normal appearance.  HENT:     Head: Normocephalic and atraumatic.     Nose: Nose normal.     Mouth/Throat:     Mouth: Mucous membranes are moist.     Pharynx: Oropharynx is clear.   Eyes:     Conjunctiva/sclera: Conjunctivae normal.     Pupils: Pupils are equal, round, and reactive to light.    Cardiovascular:     Rate and Rhythm: Normal rate and regular rhythm.     Pulses: Normal pulses.     Heart sounds: Normal heart sounds. No murmur heard. Pulmonary:     Effort: Pulmonary effort is normal.     Breath sounds: Normal breath sounds. No wheezing.  Chest:  Breasts:    Right: Normal. No swelling, bleeding, inverted nipple, mass, nipple discharge, skin change or tenderness.     Left: Normal. No swelling, bleeding, inverted nipple, mass, nipple discharge, skin change or tenderness.  Abdominal:     General: Bowel sounds are normal.     Palpations: Abdomen is soft.     Tenderness: There is no abdominal tenderness. There is no right CVA tenderness or left CVA tenderness.     Hernia: There is no hernia in the left inguinal area or right inguinal area.  Genitourinary:    General: Normal vulva.     Pubic Area: No rash or pubic lice.      Labia:        Right: No rash, tenderness, lesion or injury.        Left: No rash, tenderness, lesion or injury.      Urethra: No prolapse.     Vagina: Normal. No signs of injury and foreign body. No vaginal discharge, erythema, tenderness, bleeding, lesions or prolapsed vaginal walls.     Cervix: Normal.     Uterus: Normal.      Adnexa: Right adnexa normal and left adnexa normal.       Right: No mass, tenderness or fullness.         Left: No mass, tenderness or fullness.     Musculoskeletal:        General: Normal range of motion.     Cervical back: Normal range of motion.     Right lower leg: No edema.     Left lower leg: No edema.  Lymphadenopathy:     Upper Body:     Right upper body: No supraclavicular, axillary or pectoral  adenopathy.     Left upper body: No supraclavicular, axillary or pectoral adenopathy.     Lower Body: No right inguinal adenopathy. No left inguinal adenopathy.   Skin:    General: Skin is warm and dry.  Neurological:     General: No focal deficit present.     Mental Status: She is alert and oriented to person, place, and time.   Psychiatric:        Mood and Affect: Mood normal.        Behavior: Behavior normal.      No results found for any visits on 05/18/24.  Recent Results (from the past 2160 hours)  Hemoglobin A1c     Status: Abnormal   Collection Time: 05/13/24  8:10 AM  Result Value Ref Range   Hgb A1c MFr Bld 5.9 (H) 4.8 - 5.6 %    Comment:          Prediabetes: 5.7 - 6.4          Diabetes: >6.4          Glycemic control for adults with diabetes: <7.0    Est. average glucose Bld gHb Est-mCnc 123 mg/dL  TSH     Status: None   Collection Time: 05/13/24  8:10 AM  Result Value Ref Range   TSH 2.340 0.450 - 4.500 uIU/mL  CMP14+EGFR     Status: None   Collection Time: 05/13/24  8:10 AM  Result Value Ref Range   Glucose 86 70 - 99 mg/dL   BUN 15 8 - 27 mg/dL   Creatinine, Ser 1.02 0.57 - 1.00 mg/dL   eGFR 66 >72 ZD/GUY/4.03   BUN/Creatinine Ratio 16 12 - 28   Sodium 138 134 - 144 mmol/L   Potassium 4.2 3.5 - 5.2 mmol/L   Chloride 101 96 - 106 mmol/L   CO2 22 20 - 29 mmol/L   Calcium  10.0 8.7 - 10.3 mg/dL   Total Protein 7.0 6.0 - 8.5 g/dL   Albumin 4.1 3.9 - 4.9 g/dL   Globulin, Total 2.9 1.5 - 4.5 g/dL   Bilirubin Total 0.4 0.0 - 1.2 mg/dL   Alkaline Phosphatase 70 44 - 121 IU/L   AST 31 0 - 40 IU/L   ALT 29 0 - 32 IU/L  Lipid panel     Status: Abnormal   Collection Time: 05/13/24  8:10 AM  Result Value Ref Range   Cholesterol, Total 190 100 - 199 mg/dL   Triglycerides 67 0 - 149 mg/dL   HDL 78 >47 mg/dL   VLDL Cholesterol Cal 12 5 - 40 mg/dL   LDL Chol Calc (NIH) 425 (H) 0 - 99 mg/dL   Chol/HDL Ratio 2.4 0.0 - 4.4 ratio    Comment:                                    T. Chol/HDL Ratio                                             Men  Women                               1/2 Avg.Risk  3.4    3.3                                   Avg.Risk  5.0    4.4  2X Avg.Risk  9.6    7.1                                3X Avg.Risk 23.4   11.0       Assessment & Plan:  Continue current medications.  Patient will get her mammogram in November arranged by her work place. Problem List Items Addressed This Visit     Benign essential hypertension (Chronic)   HLD (hyperlipidemia)   Other Visit Diagnoses       Preventative health care    -  Primary     Screening for cervical cancer         Pre-diabetes           Follow up in 3 months with Amber.   Total time spent: 30 minutes  Aisha Hove, MD  05/18/2024   This document may have been prepared by [facility] Voice Recognition software and as such may include unintentional dictation errors.

## 2024-05-18 NOTE — Addendum Note (Signed)
 Addended byFrancie Irani on: 05/18/2024 02:56 PM   Modules accepted: Orders

## 2024-05-21 LAB — IGP, APTIMA HPV
HPV Aptima: NEGATIVE
PAP Smear Comment: 0

## 2024-05-24 ENCOUNTER — Ambulatory Visit: Payer: Self-pay | Admitting: Internal Medicine

## 2024-05-24 DIAGNOSIS — N761 Subacute and chronic vaginitis: Secondary | ICD-10-CM

## 2024-05-24 MED ORDER — METRONIDAZOLE 0.75 % VA GEL: 1.0000 | Freq: Two times a day (BID) | VAGINAL | 0 refills | Status: DC

## 2024-05-25 NOTE — Progress Notes (Signed)
 Patient notified

## 2024-05-25 NOTE — Telephone Encounter (Signed)
 Patient called asking for blood work results, pt was given the pap results she had not been called about them yet since they are in [facility] inbox, I will have her complete that task please advise on labs

## 2024-08-04 ENCOUNTER — Other Ambulatory Visit: Payer: Self-pay | Admitting: Cardiology

## 2024-09-14 ENCOUNTER — Other Ambulatory Visit

## 2024-09-14 DIAGNOSIS — I10 Essential (primary) hypertension: Secondary | ICD-10-CM

## 2024-09-14 DIAGNOSIS — E782 Mixed hyperlipidemia: Secondary | ICD-10-CM

## 2024-09-14 DIAGNOSIS — R7303 Prediabetes: Secondary | ICD-10-CM

## 2024-09-15 ENCOUNTER — Ambulatory Visit: Payer: Self-pay | Admitting: Cardiology

## 2024-09-15 LAB — HEMOGLOBIN A1C
Est. average glucose Bld gHb Est-mCnc: 120 mg/dL
Hgb A1c MFr Bld: 5.8 % — ABNORMAL HIGH (ref 4.8–5.6)

## 2024-09-15 LAB — CMP14+EGFR
ALT: 34 IU/L — ABNORMAL HIGH (ref 0–32)
AST: 39 IU/L (ref 0–40)
Albumin: 4.1 g/dL (ref 3.9–4.9)
Alkaline Phosphatase: 83 IU/L (ref 49–135)
BUN/Creatinine Ratio: 15 (ref 12–28)
BUN: 15 mg/dL (ref 8–27)
Bilirubin Total: 0.4 mg/dL (ref 0.0–1.2)
CO2: 24 mmol/L (ref 20–29)
Calcium: 9.7 mg/dL (ref 8.7–10.3)
Chloride: 99 mmol/L (ref 96–106)
Creatinine, Ser: 1 mg/dL (ref 0.57–1.00)
Globulin, Total: 2.7 g/dL (ref 1.5–4.5)
Glucose: 86 mg/dL (ref 70–99)
Potassium: 4.4 mmol/L (ref 3.5–5.2)
Sodium: 137 mmol/L (ref 134–144)
Total Protein: 6.8 g/dL (ref 6.0–8.5)
eGFR: 63 mL/min/1.73 (ref >59)

## 2024-09-15 LAB — TSH: TSH: 3.17 u[IU]/mL (ref 0.450–4.500)

## 2024-09-15 LAB — LIPID PANEL
Chol/HDL Ratio: 2.8 ratio (ref 0.0–4.4)
Cholesterol, Total: 185 mg/dL (ref 100–199)
HDL: 67 mg/dL (ref >39)
LDL Chol Calc (NIH): 100 mg/dL — ABNORMAL HIGH (ref 0–99)
Triglycerides: 100 mg/dL (ref 0–149)
VLDL Cholesterol Cal: 18 mg/dL (ref 5–40)

## 2024-09-17 ENCOUNTER — Ambulatory Visit: Admitting: Cardiology

## 2024-09-17 ENCOUNTER — Encounter: Payer: Self-pay | Admitting: Cardiology

## 2024-09-17 VITALS — BP 128/60 | HR 84 | Ht 64.0 in | Wt 146.0 lb

## 2024-09-17 DIAGNOSIS — I10 Essential (primary) hypertension: Secondary | ICD-10-CM

## 2024-09-17 DIAGNOSIS — E782 Mixed hyperlipidemia: Secondary | ICD-10-CM | POA: Diagnosis not present

## 2024-09-17 NOTE — Progress Notes (Signed)
 Established Patient Office Visit  Subjective:  Patient ID: Krista English, female    DOB: May 13, 1961  Age: 63 y.o. MRN: [id number]  Chief Complaint  Patient presents with   Follow-up    3 months follow up    Patient in office for 3 month follow up, discuss recent lab results. Patient doing well. Reports having a cough last week from PND, slowly going away. Recommend taking Zyrtec.  Discussed recent lab work, stable. Continue current medications.  Up to date on pap smear.  Mammogram in November at work.  Colonoscopy 12/2020. Continue current medications.    No other concerns at this time.   Past Medical History:  Diagnosis Date   GERD (gastroesophageal reflux disease)    High cholesterol    Hypertension     Past Surgical History:  Procedure Laterality Date   CESAREAN SECTION     COLONOSCOPY WITH PROPOFOL  N/A 12/29/2020   Procedure: COLONOSCOPY WITH PROPOFOL ;  Surgeon: Janalyn Keene NOVAK, MD;  Location: [facility] ENDOSCOPY;  Service: Endoscopy;  Laterality: N/A;    Social History   Socioeconomic History   Marital status: Single    Spouse name: Not on file   Number of children: Not on file   Years of education: Not on file   Highest education level: Not on file  Occupational History   Not on file  Tobacco Use   Smoking status: Every Day    Current packs/day: 0.00    Types: Cigarettes    Last attempt to quit: 12/02/1998    Years since quitting: 25.8   Smokeless tobacco: Never  Vaping Use   Vaping status: Never Used  Substance and Sexual Activity   Alcohol use: Yes    Comment: occasional   Drug use: Never   Sexual activity: Not on file  Other Topics Concern   Not on file  Social History Narrative   Not on file   Social Drivers of Health   Financial Resource Strain: Not on file  Food Insecurity: Not on file  Transportation Needs: Not on file  Physical Activity: Not on file  Stress: Not on file  Social Connections: Not on file  Intimate Partner Violence: Not  on file    Family History  Problem Relation Age of Onset   Breast cancer Neg Hx     Allergies  Allergen Reactions   Ace Inhibitors Other (See Comments), Shortness Of Breath and Cough    Other reaction(s): Cough    Outpatient Medications Prior to Visit  Medication Sig   celecoxib  (CELEBREX ) 100 MG capsule TAKE 1 CAPSULE BY MOUTH EVERY DAY   cetirizine (ZYRTEC) 10 MG tablet Take 10 mg by mouth daily.   hydrochlorothiazide  (HYDRODIURIL ) 25 MG tablet Take 1 tablet (25 mg total) by mouth daily.   metroNIDAZOLE  (METROGEL ) 0.75 % vaginal gel Place 1 Applicatorful vaginally 2 (two) times daily.   olmesartan  (BENICAR ) 20 MG tablet TAKE 1 TABLET BY MOUTH EVERY DAY   rosuvastatin  (CRESTOR ) 40 MG tablet TAKE 1 TABLET BY MOUTH EVERY DAY   No facility-administered medications prior to visit.    Review of Systems  Constitutional: Negative.   HENT: Negative.    Eyes: Negative.   Respiratory:  Positive for cough and sputum production. Negative for shortness of breath.   Cardiovascular: Negative.  Negative for chest pain.  Gastrointestinal: Negative.  Negative for abdominal pain, constipation and diarrhea.  Genitourinary: Negative.   Musculoskeletal:  Negative for joint pain and myalgias.  Skin: Negative.   Neurological: Negative.  Negative for dizziness and headaches.  Endo/Heme/Allergies: Negative.   All other systems reviewed and are negative.      Objective:   BP 128/60   Pulse 84   Ht 5' 4 (1.626 m)   Wt 146 lb (66.2 kg)   SpO2 98%   BMI 25.06 kg/m   Vitals:   09/17/24 1514  BP: 128/60  Pulse: 84  Height: 5' 4 (1.626 m)  Weight: 146 lb (66.2 kg)  SpO2: 98%  BMI (Calculated): 25.05    Physical Exam Vitals and nursing note reviewed.  Constitutional:      Appearance: Normal appearance. She is normal weight.  HENT:     Head: Normocephalic and atraumatic.     Nose: Nose normal.     Mouth/Throat:     Mouth: Mucous membranes are moist.  Eyes:     Extraocular  Movements: Extraocular movements intact.     Conjunctiva/sclera: Conjunctivae normal.     Pupils: Pupils are equal, round, and reactive to light.  Cardiovascular:     Rate and Rhythm: Normal rate and regular rhythm.     Pulses: Normal pulses.     Heart sounds: Normal heart sounds.  Pulmonary:     Effort: Pulmonary effort is normal.     Breath sounds: Normal breath sounds.  Abdominal:     General: Abdomen is flat. Bowel sounds are normal.     Palpations: Abdomen is soft.  Musculoskeletal:        General: Normal range of motion.     Cervical back: Normal range of motion.  Skin:    General: Skin is warm and dry.  Neurological:     General: No focal deficit present.     Mental Status: She is alert and oriented to person, place, and time.  Psychiatric:        Mood and Affect: Mood normal.        Behavior: Behavior normal.        Thought Content: Thought content normal.        Judgment: Judgment normal.      No results found for any visits on 09/17/24.  Recent Results (from the past 2160 hours)  Lipid panel     Status: Abnormal   Collection Time: 09/14/24  8:21 AM  Result Value Ref Range   Cholesterol, Total 185 100 - 199 mg/dL   Triglycerides 899 0 - 149 mg/dL   HDL 67 >60 mg/dL   VLDL Cholesterol Cal 18 5 - 40 mg/dL   LDL Chol Calc (NIH) 899 (H) 0 - 99 mg/dL   Chol/HDL Ratio 2.8 0.0 - 4.4 ratio    Comment:                                   T. Chol/HDL Ratio                                             Men  Women                               1/2 Avg.Risk  3.4    3.3  Avg.Risk  5.0    4.4                                2X Avg.Risk  9.6    7.1                                3X Avg.Risk 23.4   11.0   CMP14+EGFR     Status: Abnormal   Collection Time: 09/14/24  8:21 AM  Result Value Ref Range   Glucose 86 70 - 99 mg/dL   BUN 15 8 - 27 mg/dL   Creatinine, Ser 8.99 0.57 - 1.00 mg/dL   eGFR 63 >40 fO/fpw/8.26   BUN/Creatinine Ratio 15 12 -  28   Sodium 137 134 - 144 mmol/L   Potassium 4.4 3.5 - 5.2 mmol/L   Chloride 99 96 - 106 mmol/L   CO2 24 20 - 29 mmol/L   Calcium  9.7 8.7 - 10.3 mg/dL   Total Protein 6.8 6.0 - 8.5 g/dL   Albumin 4.1 3.9 - 4.9 g/dL   Globulin, Total 2.7 1.5 - 4.5 g/dL   Bilirubin Total 0.4 0.0 - 1.2 mg/dL   Alkaline Phosphatase 83 49 - 135 IU/L   AST 39 0 - 40 IU/L   ALT 34 (H) 0 - 32 IU/L  TSH     Status: None   Collection Time: 09/14/24  8:21 AM  Result Value Ref Range   TSH 3.170 0.450 - 4.500 uIU/mL  Hemoglobin A1c     Status: Abnormal   Collection Time: 09/14/24  8:21 AM  Result Value Ref Range   Hgb A1c MFr Bld 5.8 (H) 4.8 - 5.6 %    Comment:          Prediabetes: 5.7 - 6.4          Diabetes: >6.4          Glycemic control for adults with diabetes: <7.0    Est. average glucose Bld gHb Est-mCnc 120 mg/dL      Assessment & Plan:  Zyrtec for cough Continue current medications  Problem List Items Addressed This Visit       Cardiovascular and Mediastinum   Benign essential hypertension - Primary (Chronic)     Other   HLD (hyperlipidemia)    Return in about 4 months (around 01/18/2025) for fasting lab work prior.   Total time spent: 25 minutes. This time includes review of previous notes and results and patient face to face interaction during today's visit.    Jeoffrey Pollen, NP  09/17/2024   This document may have been prepared by Dragon Voice Recognition software and as such may include unintentional dictation errors.

## 2024-09-18 ENCOUNTER — Other Ambulatory Visit: Payer: Self-pay | Admitting: Cardiology

## 2024-10-05 ENCOUNTER — Other Ambulatory Visit: Payer: Self-pay

## 2024-10-05 ENCOUNTER — Emergency Department
Admission: EM | Admit: 2024-10-05 | Discharge: 2024-10-05 | Disposition: A | Discharge status: Home / Self Care | Attending: Emergency Medicine | Admitting: Emergency Medicine

## 2024-10-05 DIAGNOSIS — R232 Flushing: Secondary | ICD-10-CM | POA: Insufficient documentation

## 2024-10-05 DIAGNOSIS — I10 Essential (primary) hypertension: Secondary | ICD-10-CM | POA: Diagnosis not present

## 2024-10-05 DIAGNOSIS — E871 Hypo-osmolality and hyponatremia: Secondary | ICD-10-CM | POA: Diagnosis not present

## 2024-10-05 HISTORY — DX: Unspecified osteoarthritis, unspecified site: M19.90

## 2024-10-05 LAB — CBC WITH DIFFERENTIAL/PLATELET
Abs Immature Granulocytes: 0.04 K/uL (ref 0.00–0.07)
Basophils Absolute: 0 K/uL (ref 0.0–0.1)
Basophils Relative: 0 %
Eosinophils Absolute: 0 K/uL (ref 0.0–0.5)
Eosinophils Relative: 0 %
HCT: 41.2 % (ref 36.0–46.0)
Hemoglobin: 14.1 g/dL (ref 12.0–15.0)
Immature Granulocytes: 0 %
Lymphocytes Relative: 11 %
Lymphs Abs: 1.1 K/uL (ref 0.7–4.0)
MCH: 31.5 pg (ref 26.0–34.0)
MCHC: 34.2 g/dL (ref 30.0–36.0)
MCV: 92 fL (ref 80.0–100.0)
Monocytes Absolute: 0.4 K/uL (ref 0.1–1.0)
Monocytes Relative: 4 %
Neutro Abs: 8.4 K/uL — ABNORMAL HIGH (ref 1.7–7.7)
Neutrophils Relative %: 85 %
Platelets: 217 K/uL (ref 150–400)
RBC: 4.48 MIL/uL (ref 3.87–5.11)
RDW: 12.5 % (ref 11.5–15.5)
WBC: 10 K/uL (ref 4.0–10.5)
nRBC: 0.2 % (ref 0.0–0.2)

## 2024-10-05 LAB — COMPREHENSIVE METABOLIC PANEL WITH GFR
ALT: 24 U/L (ref 0–44)
AST: 24 U/L (ref 15–41)
Albumin: 4.2 g/dL (ref 3.5–5.0)
Alkaline Phosphatase: 53 U/L (ref 38–126)
Anion gap: 9 (ref 5–15)
BUN: 14 mg/dL (ref 8–23)
CO2: 26 mmol/L (ref 22–32)
Calcium: 9.6 mg/dL (ref 8.9–10.3)
Chloride: 95 mmol/L — ABNORMAL LOW (ref 98–111)
Creatinine, Ser: 0.77 mg/dL (ref 0.44–1.00)
GFR, Estimated: >60 mL/min (ref >60)
Glucose, Bld: 106 mg/dL — ABNORMAL HIGH (ref 70–99)
Potassium: 4 mmol/L (ref 3.5–5.1)
Sodium: 130 mmol/L — ABNORMAL LOW (ref 135–145)
Total Bilirubin: 0.8 mg/dL (ref 0.0–1.2)
Total Protein: 8.1 g/dL (ref 6.5–8.1)

## 2024-10-05 NOTE — ED Provider Notes (Signed)
 [facility] Provider Note    Event Date/Time   First MD Initiated Contact with Patient 10/05/24 [phone]     (approximate)   History   Hot Flashes   HPI  Krista English is a 63 y.o. female with PMH of hypertension, GERD, arthritis and high cholesterol presents for evaluation of hot flashes.  Patient states that she gets a warm tingling sensation in her feet that travels up her legs to the top of her head.  This makes her whole body feel hot and tingly.  She had some dizziness yesterday with this that lasted for about 2 minutes and then went away.  No dizziness today.  She reports having the hot flashes once every hour all day yesterday.  She had a hot flash this morning which is what brought her into the ED.  She denies other associated symptoms including chest pain, shortness of breath, abdominal pain, nausea, vomiting, diarrhea, constipation, urinary symptoms, cough, congestion and fever.      Physical Exam   Triage Vital Signs: ED Triage Vitals  Encounter Vitals Group     BP 10/05/24 0826 (!) 156/93     Girls Systolic BP Percentile --      Girls Diastolic BP Percentile --      Boys Systolic BP Percentile --      Boys Diastolic BP Percentile --      Pulse Rate 10/05/24 0826 79     Resp 10/05/24 0826 20     Temp 10/05/24 0826 98.3 F (36.8 C)     Temp Source 10/05/24 0826 Oral     SpO2 10/05/24 0826 100 %     Weight 10/05/24 0828 145 lb (65.8 kg)     Height 10/05/24 0828 5' 4 (1.626 m)     Head Circumference --      Peak Flow --      Pain Score 10/05/24 0828 0     Pain Loc --      Pain Education --      Exclude from Growth Chart --     Most recent vital signs: Vitals:   10/05/24 0826  BP: (!) 156/93  Pulse: 79  Resp: 20  Temp: 98.3 F (36.8 C)  SpO2: 100%   General: Awake, no distress.  CV:  Good peripheral perfusion.  RRR. Resp:  Normal effort.  CTAB. Abd:  No distention.  Other:  Posterior tibialis pulse is 2+ and regular, sensation  intact in the lower extremities, strength is equal in bilateral lower extremities   ED Results / Procedures / Treatments   Labs (all labs ordered are listed, but only abnormal results are displayed) Labs Reviewed  COMPREHENSIVE METABOLIC PANEL WITH GFR - Abnormal; Notable for the following components:      Result Value   Sodium 130 (*)    Chloride 95 (*)    Glucose, Bld 106 (*)    All other components within normal limits  CBC WITH DIFFERENTIAL/PLATELET - Abnormal; Notable for the following components:   Neutro Abs 8.4 (*)    All other components within normal limits   PROCEDURES:  Critical Care performed: No  Procedures   MEDICATIONS ORDERED IN ED: Medications - No data to display   IMPRESSION / MDM / ASSESSMENT AND PLAN / ED COURSE  I reviewed the triage vital signs and the nursing notes.  63 year old female presents for evaluation of hot flashes.  Blood pressure is elevated, vital signs stable otherwise.  Patient has history of hypertension.  NAD on exam.  Differential diagnosis includes, but is not limited to, vasomotor symptoms, electrolyte abnormality, dehydration, anemia, anxiety.  Patient's presentation is most consistent with acute complicated illness / injury requiring diagnostic workup.  Physical exam is reassuring.  Unsure of what is causing patient's hot flashes.  Possible that this is lingering from menopause.  Will plan to obtain basic labs, if reassuring will advise patient to follow-up with her primary care for possible hormone testing.  Patient voiced understanding, all questions were answered and she was stable at discharge.  Clinical Course as of 10/05/24 1051  Tue Oct 05, 2024  1047 CBC with Differential(!) Unremarkable. [LD]  1048 Comprehensive metabolic panel(!) Hyponatremia and hypochloremia, otherwise normal. Offered to give patient a liter of IV fluids, she would prefer to go home and drink water. Discussed some OTC  electrolyte products she can try. [LD]  1048 I have advised patient to follow-up with her primary care provider, and to return to the ED with any worsening symptoms.   [LD]    Clinical Course User Index [LD] Cleaster Tinnie LABOR, PA-C     FINAL CLINICAL IMPRESSION(S) / ED DIAGNOSES   Final diagnoses:  Hot flashes     Rx / DC Orders   ED Discharge Orders     None        Note:  This document was prepared using Dragon voice recognition software and may include unintentional dictation errors.   Cleaster Tinnie LABOR, PA-C 10/05/24 1051    Dorothyann Drivers, MD 10/05/24 1436

## 2024-10-05 NOTE — ED Triage Notes (Signed)
 Pt to ED POV stating that on Sunday she was having hot flashes and at the same time felt a sensation around her rectum and also began feeling like whole body was tingling and felt dizzy for about 2 minutes. Has not been dizzy today. Did have a hot flash this AM. States that back in April she also had a hot flash and dizzy and this was after her sister died. She went to ED and was told she was dehydrated. Pt is talkative in triage. Speaking in full sentences with unlabored respirations. Walks with steady gait. Speech clear, no arm drift.

## 2024-10-05 NOTE — Discharge Instructions (Signed)
 Your labs showed that you may be a little bit dehydrated today.  Make sure you drink lots of water.  You can try electrolyte supplementation by using products like LMNT or liquid IV.  You can also put a little bit of salt and lemon juice in your water.  Please follow-up with your primary care provider who can do some further hormonal testing.  Return to the ED with any worsening symptoms.

## 2024-10-12 ENCOUNTER — Other Ambulatory Visit: Payer: Self-pay | Admitting: Cardiology

## 2024-10-12 DIAGNOSIS — Z1231 Encounter for screening mammogram for malignant neoplasm of breast: Secondary | ICD-10-CM

## 2024-10-20 ENCOUNTER — Other Ambulatory Visit: Payer: Self-pay | Admitting: Cardiology

## 2024-10-30 ENCOUNTER — Other Ambulatory Visit: Payer: Self-pay | Admitting: Internal Medicine

## 2024-10-30 DIAGNOSIS — N761 Subacute and chronic vaginitis: Secondary | ICD-10-CM

## 2024-11-02 ENCOUNTER — Ambulatory Visit
Admission: RE | Admit: 2024-11-02 | Discharge: 2024-11-02 | Disposition: A | Source: Ambulatory Visit | Attending: Cardiology

## 2024-11-02 DIAGNOSIS — Z1231 Encounter for screening mammogram for malignant neoplasm of breast: Secondary | ICD-10-CM | POA: Insufficient documentation

## 2024-11-16 ENCOUNTER — Other Ambulatory Visit: Payer: Self-pay | Admitting: Cardiology

## 2025-01-18 ENCOUNTER — Ambulatory Visit: Admitting: Cardiology
